# Patient Record
Sex: Male | Born: 1945 | Race: White | Hispanic: No | Marital: Single | Smoking: Never smoker
Health system: Southern US, Community
[De-identification: ages and names within clinical notes are randomized; demographics above are authoritative.]

## PROBLEM LIST (undated history)

## (undated) DIAGNOSIS — N289 Disorder of kidney and ureter, unspecified: Secondary | ICD-10-CM

## (undated) DIAGNOSIS — D369 Benign neoplasm, unspecified site: Secondary | ICD-10-CM

## (undated) DIAGNOSIS — E785 Hyperlipidemia, unspecified: Secondary | ICD-10-CM

## (undated) DIAGNOSIS — Z87442 Personal history of urinary calculi: Secondary | ICD-10-CM

## (undated) DIAGNOSIS — C61 Malignant neoplasm of prostate: Secondary | ICD-10-CM

## (undated) DIAGNOSIS — I10 Essential (primary) hypertension: Secondary | ICD-10-CM

## (undated) DIAGNOSIS — N529 Male erectile dysfunction, unspecified: Secondary | ICD-10-CM

## (undated) HISTORY — PX: PROSTATECTOMY: SHX69

## (undated) HISTORY — PX: TONSILLECTOMY: SUR1361

## (undated) HISTORY — DX: Hyperlipidemia, unspecified: E78.5

## (undated) HISTORY — DX: Benign neoplasm, unspecified site: D36.9

## (undated) HISTORY — DX: Disorder of kidney and ureter, unspecified: N28.9

## (undated) HISTORY — PX: COLONOSCOPY: SHX174

## (undated) HISTORY — PX: CARDIAC CATHETERIZATION: SHX172

## (undated) HISTORY — PX: CHOLECYSTECTOMY: SHX55

## (undated) HISTORY — PX: HERNIA REPAIR: SHX51

## (undated) HISTORY — DX: Male erectile dysfunction, unspecified: N52.9

## (undated) HISTORY — DX: Personal history of urinary calculi: Z87.442

---

## 2004-08-21 ENCOUNTER — Ambulatory Visit: Payer: Self-pay | Admitting: Gastroenterology

## 2004-09-24 ENCOUNTER — Ambulatory Visit: Payer: Self-pay | Admitting: Urology

## 2006-12-06 ENCOUNTER — Ambulatory Visit: Payer: Self-pay | Admitting: Urology

## 2007-09-05 ENCOUNTER — Ambulatory Visit: Payer: Self-pay | Admitting: Family Medicine

## 2007-09-08 ENCOUNTER — Ambulatory Visit: Payer: Self-pay | Admitting: Family Medicine

## 2007-11-22 ENCOUNTER — Ambulatory Visit: Payer: Self-pay | Admitting: Otolaryngology

## 2011-09-07 ENCOUNTER — Emergency Department: Payer: Self-pay | Admitting: Emergency Medicine

## 2011-09-07 LAB — COMPREHENSIVE METABOLIC PANEL
Albumin: 4.1 g/dL (ref 3.4–5.0)
Alkaline Phosphatase: 110 U/L (ref 50–136)
Calcium, Total: 10 mg/dL (ref 8.5–10.1)
Creatinine: 1.77 mg/dL — ABNORMAL HIGH (ref 0.60–1.30)
EGFR (Non-African Amer.): 41 — ABNORMAL LOW
Osmolality: 288 (ref 275–301)
Potassium: 4.1 mmol/L (ref 3.5–5.1)
SGOT(AST): 56 U/L — ABNORMAL HIGH (ref 15–37)
Sodium: 142 mmol/L (ref 136–145)

## 2011-09-07 LAB — URINALYSIS, COMPLETE
Bacteria: NONE SEEN
Bilirubin,UR: NEGATIVE
Glucose,UR: NEGATIVE mg/dL (ref 0–75)
Ketone: NEGATIVE
Protein: NEGATIVE
RBC,UR: <1 /HPF (ref 0–5)
Specific Gravity: 1.017 (ref 1.003–1.030)
Squamous Epithelial: NONE SEEN
WBC UR: 1 /HPF (ref 0–5)

## 2011-09-07 LAB — CBC
HCT: 43.4 % (ref 40.0–52.0)
HGB: 14.6 g/dL (ref 13.0–18.0)
MCH: 28.9 pg (ref 26.0–34.0)
RDW: 14 % (ref 11.5–14.5)
WBC: 11.2 10*3/uL — ABNORMAL HIGH (ref 3.8–10.6)

## 2011-09-07 LAB — LIPASE, BLOOD: Lipase: 180 U/L (ref 73–393)

## 2011-09-07 LAB — TROPONIN I: Troponin-I: <0.02 ng/mL

## 2011-11-20 ENCOUNTER — Inpatient Hospital Stay: Payer: Self-pay | Admitting: Surgery

## 2011-11-20 LAB — URINALYSIS, COMPLETE
Bilirubin,UR: NEGATIVE
Glucose,UR: NEGATIVE mg/dL (ref 0–75)
Hyaline Cast: 13
Nitrite: NEGATIVE
Ph: 5 (ref 4.5–8.0)
Protein: 30
Specific Gravity: 1.02 (ref 1.003–1.030)
Squamous Epithelial: 1
Transitional Epi: <1
WBC UR: 7 /HPF (ref 0–5)

## 2011-11-20 LAB — CBC
HGB: 14.9 g/dL (ref 13.0–18.0)
MCHC: 33.8 g/dL (ref 32.0–36.0)
MCV: 86 fL (ref 80–100)
Platelet: 262 10*3/uL (ref 150–440)
RDW: 13.7 % (ref 11.5–14.5)
WBC: 14.9 10*3/uL — ABNORMAL HIGH (ref 3.8–10.6)

## 2011-11-20 LAB — COMPREHENSIVE METABOLIC PANEL
Albumin: 3.8 g/dL (ref 3.4–5.0)
Alkaline Phosphatase: 115 U/L (ref 50–136)
Anion Gap: 15 (ref 7–16)
BUN: 38 mg/dL — ABNORMAL HIGH (ref 7–18)
Co2: 20 mmol/L — ABNORMAL LOW (ref 21–32)
EGFR (Non-African Amer.): 21 — ABNORMAL LOW
Glucose: 123 mg/dL — ABNORMAL HIGH (ref 65–99)
SGOT(AST): 42 U/L — ABNORMAL HIGH (ref 15–37)
SGPT (ALT): 16 U/L
Sodium: 132 mmol/L — ABNORMAL LOW (ref 136–145)
Total Protein: 9.5 g/dL — ABNORMAL HIGH (ref 6.4–8.2)

## 2011-11-20 LAB — CK TOTAL AND CKMB (NOT AT ARMC)
CK, Total: 67 U/L (ref 35–232)
CK-MB: 0.5 ng/mL (ref 0.5–3.6)

## 2011-11-20 LAB — TROPONIN I: Troponin-I: <0.02 ng/mL

## 2011-11-21 LAB — COMPREHENSIVE METABOLIC PANEL
Albumin: 2.4 g/dL — ABNORMAL LOW (ref 3.4–5.0)
Alkaline Phosphatase: 107 U/L (ref 50–136)
BUN: 34 mg/dL — ABNORMAL HIGH (ref 7–18)
Bilirubin,Total: 1.5 mg/dL — ABNORMAL HIGH (ref 0.2–1.0)
Calcium, Total: 8.1 mg/dL — ABNORMAL LOW (ref 8.5–10.1)
Chloride: 102 mmol/L (ref 98–107)
Co2: 24 mmol/L (ref 21–32)
Creatinine: 2.79 mg/dL — ABNORMAL HIGH (ref 0.60–1.30)
Glucose: 94 mg/dL (ref 65–99)
Osmolality: 279 (ref 275–301)
Potassium: 4.1 mmol/L (ref 3.5–5.1)
SGOT(AST): 29 U/L (ref 15–37)
SGPT (ALT): 12 U/L
Sodium: 136 mmol/L (ref 136–145)
Total Protein: 6.5 g/dL (ref 6.4–8.2)

## 2011-11-21 LAB — CBC WITH DIFFERENTIAL/PLATELET
Basophil #: 0 10*3/uL (ref 0.0–0.1)
Basophil %: 0.2 %
Eosinophil #: 0.1 10*3/uL (ref 0.0–0.7)
Eosinophil %: 0.7 %
HGB: 10.9 g/dL — ABNORMAL LOW (ref 13.0–18.0)
Lymphocyte #: 1.2 10*3/uL (ref 1.0–3.6)
Lymphocyte %: 10.3 %
MCHC: 34.3 g/dL (ref 32.0–36.0)
MCV: 86 fL (ref 80–100)
Monocyte #: 0.5 10*3/uL (ref 0.0–0.7)
Neutrophil #: 9.5 10*3/uL — ABNORMAL HIGH (ref 1.4–6.5)
Neutrophil %: 84.1 %
RBC: 3.67 10*6/uL — ABNORMAL LOW (ref 4.40–5.90)
RDW: 13.8 % (ref 11.5–14.5)

## 2011-11-22 LAB — COMPREHENSIVE METABOLIC PANEL
Albumin: 2.5 g/dL — ABNORMAL LOW (ref 3.4–5.0)
Anion Gap: 9 (ref 7–16)
BUN: 19 mg/dL — ABNORMAL HIGH (ref 7–18)
Bilirubin,Total: 1.4 mg/dL — ABNORMAL HIGH (ref 0.2–1.0)
Calcium, Total: 8.7 mg/dL (ref 8.5–10.1)
Chloride: 101 mmol/L (ref 98–107)
Co2: 26 mmol/L (ref 21–32)
Creatinine: 2.22 mg/dL — ABNORMAL HIGH (ref 0.60–1.30)
EGFR (African American): 38 — ABNORMAL LOW
EGFR (Non-African Amer.): 32 — ABNORMAL LOW
Glucose: 95 mg/dL (ref 65–99)
Osmolality: 274 (ref 275–301)
Potassium: 4.1 mmol/L (ref 3.5–5.1)
SGOT(AST): 36 U/L (ref 15–37)

## 2011-11-22 LAB — CBC WITH DIFFERENTIAL/PLATELET
Basophil %: 0.1 %
Eosinophil #: 0.1 10*3/uL (ref 0.0–0.7)
Eosinophil %: 0.5 %
HCT: 34.6 % — ABNORMAL LOW (ref 40.0–52.0)
HGB: 11.5 g/dL — ABNORMAL LOW (ref 13.0–18.0)
Lymphocyte #: 1 10*3/uL (ref 1.0–3.6)
Lymphocyte %: 9.7 %
MCH: 29.1 pg (ref 26.0–34.0)
MCHC: 33.4 g/dL (ref 32.0–36.0)
MCV: 87 fL (ref 80–100)
Monocyte #: 0.8 10*3/uL — ABNORMAL HIGH (ref 0.0–0.7)
Neutrophil %: 82.5 %
RDW: 13.5 % (ref 11.5–14.5)
WBC: 10.5 10*3/uL (ref 3.8–10.6)

## 2011-11-23 LAB — COMPREHENSIVE METABOLIC PANEL
Albumin: 2.1 g/dL — ABNORMAL LOW (ref 3.4–5.0)
Calcium, Total: 8.2 mg/dL — ABNORMAL LOW (ref 8.5–10.1)
Chloride: 102 mmol/L (ref 98–107)
Co2: 24 mmol/L (ref 21–32)
Creatinine: 1.92 mg/dL — ABNORMAL HIGH (ref 0.60–1.30)
EGFR (African American): 45 — ABNORMAL LOW
EGFR (Non-African Amer.): 37 — ABNORMAL LOW
Glucose: 108 mg/dL — ABNORMAL HIGH (ref 65–99)
Osmolality: 273 (ref 275–301)
Potassium: 4.1 mmol/L (ref 3.5–5.1)
SGOT(AST): 52 U/L — ABNORMAL HIGH (ref 15–37)
Total Protein: 6.2 g/dL — ABNORMAL LOW (ref 6.4–8.2)

## 2011-11-23 LAB — CBC WITH DIFFERENTIAL/PLATELET
Basophil %: 0.2 %
Eosinophil %: 1.6 %
HCT: 29.5 % — ABNORMAL LOW (ref 40.0–52.0)
Lymphocyte #: 0.8 10*3/uL — ABNORMAL LOW (ref 1.0–3.6)
Lymphocyte %: 9.8 %
MCH: 29.5 pg (ref 26.0–34.0)
MCV: 86 fL (ref 80–100)
Monocyte #: 0.9 10*3/uL — ABNORMAL HIGH (ref 0.0–0.7)
Platelet: 183 10*3/uL (ref 150–440)
RBC: 3.44 10*6/uL — ABNORMAL LOW (ref 4.40–5.90)
RDW: 13.7 % (ref 11.5–14.5)
WBC: 8.1 10*3/uL (ref 3.8–10.6)

## 2011-11-23 LAB — PATHOLOGY REPORT

## 2011-11-24 LAB — COMPREHENSIVE METABOLIC PANEL
Albumin: 2 g/dL — ABNORMAL LOW (ref 3.4–5.0)
Alkaline Phosphatase: 149 U/L — ABNORMAL HIGH (ref 50–136)
BUN: 12 mg/dL (ref 7–18)
Bilirubin,Total: 0.4 mg/dL (ref 0.2–1.0)
Calcium, Total: 8.2 mg/dL — ABNORMAL LOW (ref 8.5–10.1)
Co2: 27 mmol/L (ref 21–32)
Creatinine: 1.99 mg/dL — ABNORMAL HIGH (ref 0.60–1.30)
EGFR (African American): 44 — ABNORMAL LOW
EGFR (Non-African Amer.): 36 — ABNORMAL LOW
Glucose: 104 mg/dL — ABNORMAL HIGH (ref 65–99)
Osmolality: 278 (ref 275–301)
Potassium: 3.9 mmol/L (ref 3.5–5.1)
SGOT(AST): 58 U/L — ABNORMAL HIGH (ref 15–37)
Total Protein: 6.2 g/dL — ABNORMAL LOW (ref 6.4–8.2)

## 2011-11-24 LAB — CBC WITH DIFFERENTIAL/PLATELET
Basophil #: 0 10*3/uL (ref 0.0–0.1)
Basophil %: 0.6 %
Lymphocyte %: 14.2 %
MCH: 29.4 pg (ref 26.0–34.0)
MCHC: 34.2 g/dL (ref 32.0–36.0)
Monocyte #: 0.8 10*3/uL — ABNORMAL HIGH (ref 0.0–0.7)
Monocyte %: 14.3 %
RBC: 3.39 10*6/uL — ABNORMAL LOW (ref 4.40–5.90)
RDW: 13.8 % (ref 11.5–14.5)

## 2013-07-30 ENCOUNTER — Emergency Department: Payer: Self-pay | Admitting: Emergency Medicine

## 2013-08-09 ENCOUNTER — Emergency Department: Payer: Self-pay | Admitting: Emergency Medicine

## 2014-10-10 ENCOUNTER — Ambulatory Visit: Payer: Self-pay | Admitting: Gastroenterology

## 2014-12-15 NOTE — Op Note (Signed)
PATIENT NAME:  Billy Walker, Billy Walker MR#:  [id number] DATE OF BIRTH:  May 20, 1946  DATE OF PROCEDURE:  11/22/2011  PREOPERATIVE DIAGNOSIS: Acute cholecystitis.   POSTOPERATIVE DIAGNOSIS: Acute cholecystitis.   PROCEDURE: Laparoscopic cholecystectomy.   SURGEON: Phoebe Perch, MD   ANESTHESIA: General with endotracheal tube.   INDICATIONS: This is a patient with unrelenting right upper quadrant pain. He has been ill for approximately two months, worsening over the last week. Preoperatively we discussed the rationale for surgery, the options of observation, risks of bleeding, infection, recurrence of symptoms, failure to resolve the symptoms, open procedure, bile duct damage, bile duct leak, bowel injury, retained common bile duct stone, any of which could require further surgery and/or ERCP, stent, and papillotomy. This was all reviewed for him in the preop holding area again prior to surgery, and he understood and agreed to proceed. No family was present. Questions were answered for him.   FINDINGS: Acute and long-standing chronic cholecystitis. It was superimposed with acute cholecystitis. Two large gallstones were retrieved.   DESCRIPTION OF PROCEDURE: The patient was induced to general anesthesia. He was on IV antibiotics and VTE prophylaxis. He was prepped and draped in a sterile fashion. Marcaine was infiltrated in the skin and subcutaneous tissues around the supraumbilical area. An incision was made, a Veress needle was placed, pneumoperitoneum was obtained, and a 5 mm trocar port was placed. The abdominal cavity was explored, and under direct vision a 10 mm epigastric port and two lateral 5 mm ports were placed. The camera was placed in the epigastric site to view back at the periumbilical site where he had had some sort of hernia repaired at the time of his prior prostatectomy. There was no sign of adhesions and no sign of bowel injury.   With the camera in the periumbilical site, the  gallbladder was found to be completely encased in fibrinopurulent-covered omentum. This omentum was brought back with both sharp and blunt dissection off of the liver edge allowing for visualization of the fundus of the gallbladder. The gallbladder fundus was elevated, and it was necessary to change to a 30-degree scope at this point. The chronic and very dense and fibrous adhesions of omentum were taken down bluntly and sharply on the anterior surface of the gallbladder allowing for visualization of the infundibulum of the gallbladder. Here, two separate small vessels were doubly clipped and divided, one of which was believed to be the cystic artery; and this allowed for good visualization of what was believed to be the cystic duct. The cystic duct was incised and bilious material came from it, although it was somewhat white in color suggesting hydropic material. This was then doubly clipped and fully divided, and the gallbladder was taken from the gallbladder fossa with both sharp, blunt and electrocautery dissection. It was greatly thickened and fibrous and also with superimposed acute inflammation with edema. The specimen was passed out through the epigastric port site with the aid of an EndoCatch bag. Two spilled stones were identified and retrieved. They were large berry-type stones, and no further stone fragments were identified in the abdomen. The area was irrigated with copious amounts of normal saline. There was no sign of bile leak at this point.   The specimen was examined on the back table and it was opened. A portion of the wall of the gallbladder appeared to be missing cephalad, and the cystic duct was attempted to be cannulated but it was so fibrotic that it was completely occluded; and no other  exit point from the gallbladder was identified.   With the findings of probable remaining gallbladder wall on the liver, attention was turned to the fundus portion of the gallbladder fossa where some  mucosa was identified. This was fulgurated with electrocautery. The area was again checked for hemostasis. There was a minimal ooze from the gallbladder fossa, and Surgicel was placed after irrigating and ensuring that there was no arterial bleeding or bleeding that could be stopped with electrocautery.   Into the foramen of [place] through a lateral port site was placed a 10 mm JP drain which was held in with 3-0 nylon. Again, hemostasis was found to be adequate. There was no sign of bile leak or bowel injury. The camera was again placed in the epigastric port to view back at the periumbilical site. There was no sign of bowel injury or adhesions, therefore, pneumoperitoneum was released. All ports were removed. The fascial edges at the epigastric site were approximated with 0 Vicryl figure-of-eight sutures. The drain was placed to bulb suction, and then a 4-0 subcuticular Monocryl was used at all skin edges. Steri-Strips, Mastisol, and sterile dressings were placed.      The patient tolerated the procedure well. There were no complications. He was taken to the recovery room in stable condition to be admitted for continued care.   ____________________________ Jerrol Banana. Burt Knack, MD rec:cbb D: 11/22/2011 11:32:42 ET T: 11/22/2011 13:48:41 ET JOB#: [id number]  cc: Jerrol Banana. Burt Knack, MD, <Dictator> Florene Glen MD ELECTRONICALLY SIGNED 11/23/2011 9:55

## 2014-12-15 NOTE — H&P (Signed)
Subjective/Chief Complaint RUQ apin    History of Present Illness 2 mos rec pain, nausea no emesis pain much worse today. no f/c no dark urine    Past History PMH prostate cancer HTN  PSH robotic prostatectomy, hernia repair    Past Medical Health Hypertension, Cancer   Past Med/Surgical Hx:  Hypertension:   ALLERGIES:  No Known Allergies:   Family and Social History:   Family History Non-Contributory    Social History negative tobacco, negative ETOH, Passenger transport manager of [employer]   Review of Systems:   Fever/Chills No    Cough No    Abdominal Pain Yes    Diarrhea No    Constipation No    Nausea/Vomiting No    SOB/DOE No    Chest Pain No    Dysuria No   Physical Exam:   GEN NAD    HEENT pink conjunctivae    NECK supple    RESP normal resp effort  clear BS    CARD regular rate    ABD positive tenderness  distended  RUQ with Murphy's sign    LYMPH negative neck    EXTR negative edema    SKIN normal to palpation, no jaundice    PSYCH alert, A+O to time, place, person, good insight   Routine Hem:  30-Mar-13 11:58    WBC (CBC) 14.9   RBC (CBC) 5.12   Hemoglobin (CBC) 14.9   Hematocrit (CBC) 44.0   Platelet Count (CBC) 262   MCV 86   MCH 29.1   MCHC 33.8   RDW 13.7  Routine Chem:  30-Mar-13 11:58    Glucose, Serum 123   BUN 38   Creatinine (comp) 3.20   Sodium, Serum 132   Potassium, Serum 4.0   Chloride, Serum 97   CO2, Serum 20   Calcium (Total), Serum 9.4  Hepatic:  30-Mar-13 11:58    Bilirubin, Total 1.7   Alkaline Phosphatase 115   SGPT (ALT) 16   SGOT (AST) 42   Total Protein, Serum 9.5   Albumin, Serum 3.8  Routine Chem:  30-Mar-13 11:58    Osmolality (calc) 275   eGFR (African American) 25   eGFR (Non-African American) 21   Anion Gap 15  Cardiac:  30-Mar-13 11:58    CK, Total 67   CPK-MB, Serum 0.5   Troponin I < 0.02  Routine Chem:  30-Mar-13 11:58    Lipase 224   Radiology  Results: XRay:    30-Mar-13 11:27, Chest PA and Lateral   Chest PA and Lateral   REASON FOR EXAM:    pain  COMMENTS:   May transport without cardiac monitor    PROCEDURE: DXR - DXR CHEST PA (OR AP) AND LATERAL  - Nov 20 2011 11:27AM     RESULT: There is very shallow inspiratory effort. A band of density at   the right lung base suggests minimal right middle lobe atelectasis. The   heart size is normal. There is no edema, significant effusion or   pneumothorax. Minimal density at the left lung base suggests atelectasis.   There is no effusion.    IMPRESSION:   1. No acute cardiopulmonary disease evident. Shallow inspiration with   basilar atelectasis right greater than left.      Verified By: Sundra Aland, M.D., MD  CT:    30-Mar-13 12:17, CT Abdomen Pelvis WO for Stone   CT Abdomen Pelvis WO for [employer]  REASON FOR EXAM:    r flank pain, sob  COMMENTS:       PROCEDURE: CT  - CT ABDOMEN /PELVIS WO (STONE)  - Nov 20 2011 12:17PM     RESULT: Emergent CT of the abdomen and pelvis without contrast is   reconstructed at 3.0 mm slice thickness in the axial plane and compared   to previous exams dated 07 September 2011.    The lung bases appear clear. A small hiatal hernia is present. There is   significant pericolonic stranding involving the right colon especially in   the subhepatic region in the area ofthe hepatic flexure with significant   pericholecystic stranding. Correlate for acute cholecystitis versus the   less likely differential consideration of colitis. The appendix appears   normal.  The thoracic aorta contains atherosclerotic calcification without   evidence of an aneurysm area the liver is unremarkable. The pancreas,   spleen, adrenal glands and kidneys appear grossly normal for a   noncontrast exam. There is a slight outward protrude into the lateral   aspect of the mid to lower right kidney. Correlate on subsequent   ultrasound for possible right renal mass.  No abnormal bowel distention is   present. There is no evidence of perforation. There is no abscess. The   urinary bladder is nondistended. There is a calcification present within   the urinary bladder which is noted on the previous exam as well. This   measures is much as 5.5 mm diameter. The second calcification is seen in   the midline dependent region the bladder and is slightly smaller. This   was also present onthe previous exam.    No definite radiopaque gallstones are evident.    IMPRESSION:   1. Findings concerning for acute cholecystitis. Correlate with abdominal   sonogram area differential considerations include colitis or less likely,   diverticulitis in the right colon. No abscess or bowel perforation is   evident.  2. Small sliding-type hiatal hernia.  3. Slight deformity of the mid to lower portion of the right kidney   laterally. This is unchanged. The possibility of a small right renal mass   or slight lobulation should be evaluated with ultrasound or followup   contrast enhanced study at a later date.(*)          Verified By: Sundra Aland, M.D., MD     Assessment/Admission Diagnosis ac cholecystitis renal failure admit hydrate recheck labs lap chole when RF improved   Electronic Signatures: Florene Glen (MD)  (Signed 30-Mar-13 14:00)  Authored: CHIEF COMPLAINT and HISTORY, PAST MEDICAL/SURGIAL HISTORY, ALLERGIES, FAMILY AND SOCIAL HISTORY, REVIEW OF SYSTEMS, PHYSICAL EXAM, LABS, Radiology, ASSESSMENT AND PLAN   Last Updated: 30-Mar-13 14:00 by Florene Glen (MD)

## 2014-12-15 NOTE — Discharge Summary (Signed)
PATIENT NAME:  Billy Walker, Billy Walker MR#:  [id number] DATE OF BIRTH:  [birth year]  DATE OF ADMISSION:  11/20/2011 DATE OF DISCHARGE:  11/24/2011  DISCHARGE DIAGNOSES:  1. Cholelithiasis with acute cholecystitis.  2. Hypertension. 3. Prostate cancer.   PROCEDURE: Laparoscopic cholecystectomy.  HISTORY OF PRESENT ILLNESS/HOSPITAL COURSE: This is a patient who was admitted to the hospital with severe right upper quadrant pain. He also had labs suggestive of acute renal failure. He was admitted to the hospital and started on IV antibiotics. He continued to have fevers. Fluid hydration and resuscitation resulted in improvement in his creatinine below 2, from 3.5, at which time he was taken to the operating room for laparoscopic cholecystectomy. His gallbladder was very inflamed and infected. Postoperatively, his JP drain showed no bile. It was ultimately removed. He was advanced to a regular diet, his fever abated, and he was discharged in stable condition with instructions to resume his regular home medications, and sent home with Vicodin. He was instructed on dressing changes and to follow up in my office in 10 days or to call if he has any problems whatsoever. ____________________________ Jerrol Banana Burt Knack, MD rec:slb D: 11/24/2011 08:23:51 ET T: 11/24/2011 14:03:36 ET JOB#: [id number]  cc: Jerrol Banana. Burt Knack, MD, <Dictator> Florene Glen MD ELECTRONICALLY SIGNED 11/24/2011 17:26

## 2014-12-15 NOTE — H&P (Signed)
PATIENT NAME:  Billy Walker, Billy Walker MR#:  [id number] DATE OF BIRTH:  [birth year]  DATE OF ADMISSION:  11/20/2011  CHIEF COMPLAINT: Right upper quadrant pain.   HISTORY OF PRESENT ILLNESS: This is a patient with approximately two months of abdominal pain and it comes and goes. Yesterday he had a bad attack of the pain and then today it was even worse than what it had been and he's had some nausea but no emesis. He has had no fevers or chills. No jaundice or acholic stools. He denies melena or hematochezia.   I was asked to see the patient for probable acute cholecystitis in the ER after CT scan was suggestive of that.   PAST MEDICAL HISTORY:  1. Hypertension. 2. Prostate cancer.   PAST SURGICAL HISTORY: Robotic radical prostatectomy presumed at [facility] as well as concomitant probable umbilical hernia repair.   ALLERGIES: None.   MEDICATIONS:  1. Amlodipine. 2. Hydrochlorothiazide.   FAMILY HISTORY: Noncontributory.   SOCIAL HISTORY: The patient does not smoke or drink. He runs a Warehouse manager.  REVIEW OF SYSTEMS: 10 system review was performed and negative with the exception of that mentioned in the history of present illness.   PHYSICAL EXAMINATION:   GENERAL: Healthy, comfortable appearing Caucasian male patient.   VITAL SIGNS: Temperature 96.6, pulse 80, respirations 22, blood pressure 93/59, 97% room air sat. Pain scale is 7.   HEENT: No scleral icterus.   NECK: No palpable neck nodes.   CHEST: Clear to auscultation.   CARDIAC: Regular rate and rhythm.   ABDOMEN: Abdomen is showing tenderness and some guarding in the right upper quadrant and a positive Murphy's sign. Multiple scars are noted, the largest one being in the periumbilical area. No sign of recurrent hernia.   INTEGUMENTARY: No jaundice.   EXTREMITIES: Calves are nontender. Extremities are without edema.   NEUROLOGIC: Grossly intact.   LABORATORY, DIAGNOSTIC, AND RADIOLOGICAL DATA: Lipase 224. AST 42, ALT 16,  bilirubin 1.7, CO2 20, sodium 132, creatinine 3.2, BUN 38, glucose 123. CT scan will be personally reviewed. It is suggestive of acute cholecystitis. On previous CT scan done in January there was no mention of gallstones in that report.   ASSESSMENT AND PLAN: This is a patient with likely acute cholecystitis. The patient's creatinine is quite elevated and he needs to be hydrated. He has not been eating or drinking much this week due to his pain. He has not been vomiting, however. Without in mind, I would like to admit the patient to the hospital, start IV antibiotics, and treat his electrolyte abnormalities and treat his renal failure which is likely related to ATN. Fluid boluses will be given and he will be re-examined. Once his renal failure is improved, then laparoscopic cholecystectomy can be performed safely. This plan and approach was discussed with him. He understood and agreed to proceed.  ____________________________ Jerrol Banana Burt Knack, MD rec:drc D: 11/20/2011 14:04:37 ET T: 11/20/2011 14:24:01 ET JOB#: [id number] Florene Glen MD ELECTRONICALLY SIGNED 11/20/2011 17:09

## 2014-12-16 LAB — SURGICAL PATHOLOGY

## 2017-04-10 ENCOUNTER — Inpatient Hospital Stay
Admission: EM | Admit: 2017-04-10 | Discharge: 2017-04-12 | DRG: 247 | Disposition: A | Discharge status: Home / Self Care | Payer: Medicare Other | Attending: Internal Medicine | Admitting: Internal Medicine

## 2017-04-10 ENCOUNTER — Encounter: Payer: Self-pay | Admitting: Emergency Medicine

## 2017-04-10 ENCOUNTER — Emergency Department: Payer: Medicare Other

## 2017-04-10 ENCOUNTER — Encounter
Admission: EM | Disposition: A | Discharge status: Home / Self Care | Payer: Self-pay | Source: Home / Self Care | Attending: Internal Medicine

## 2017-04-10 DIAGNOSIS — N183 Chronic kidney disease, stage 3 (moderate): Secondary | ICD-10-CM | POA: Diagnosis present

## 2017-04-10 DIAGNOSIS — I10 Essential (primary) hypertension: Secondary | ICD-10-CM

## 2017-04-10 DIAGNOSIS — Z9049 Acquired absence of other specified parts of digestive tract: Secondary | ICD-10-CM

## 2017-04-10 DIAGNOSIS — Z8249 Family history of ischemic heart disease and other diseases of the circulatory system: Secondary | ICD-10-CM

## 2017-04-10 DIAGNOSIS — R0789 Other chest pain: Secondary | ICD-10-CM | POA: Diagnosis present

## 2017-04-10 DIAGNOSIS — I255 Ischemic cardiomyopathy: Secondary | ICD-10-CM | POA: Diagnosis present

## 2017-04-10 DIAGNOSIS — Z7982 Long term (current) use of aspirin: Secondary | ICD-10-CM

## 2017-04-10 DIAGNOSIS — I129 Hypertensive chronic kidney disease with stage 1 through stage 4 chronic kidney disease, or unspecified chronic kidney disease: Secondary | ICD-10-CM | POA: Diagnosis present

## 2017-04-10 DIAGNOSIS — Z79899 Other long term (current) drug therapy: Secondary | ICD-10-CM

## 2017-04-10 DIAGNOSIS — I2102 ST elevation (STEMI) myocardial infarction involving left anterior descending coronary artery: Secondary | ICD-10-CM | POA: Diagnosis present

## 2017-04-10 DIAGNOSIS — E785 Hyperlipidemia, unspecified: Secondary | ICD-10-CM | POA: Diagnosis present

## 2017-04-10 DIAGNOSIS — I2510 Atherosclerotic heart disease of native coronary artery without angina pectoris: Secondary | ICD-10-CM | POA: Diagnosis present

## 2017-04-10 DIAGNOSIS — Z9079 Acquired absence of other genital organ(s): Secondary | ICD-10-CM | POA: Diagnosis not present

## 2017-04-10 DIAGNOSIS — I213 ST elevation (STEMI) myocardial infarction of unspecified site: Secondary | ICD-10-CM | POA: Diagnosis present

## 2017-04-10 DIAGNOSIS — Z801 Family history of malignant neoplasm of trachea, bronchus and lung: Secondary | ICD-10-CM

## 2017-04-10 DIAGNOSIS — Z8546 Personal history of malignant neoplasm of prostate: Secondary | ICD-10-CM

## 2017-04-10 HISTORY — DX: Malignant neoplasm of prostate: C61

## 2017-04-10 HISTORY — PX: CORONARY STENT INTERVENTION: CATH118234

## 2017-04-10 HISTORY — DX: Essential (primary) hypertension: I10

## 2017-04-10 HISTORY — PX: LEFT HEART CATH AND CORONARY ANGIOGRAPHY: CATH118249

## 2017-04-10 LAB — COMPREHENSIVE METABOLIC PANEL
ALBUMIN: 4.2 g/dL (ref 3.5–5.0)
ALK PHOS: 108 U/L (ref 38–126)
ALT: 17 U/L (ref 17–63)
ALT: 16 U/L — AB (ref 17–63)
AST: 52 U/L — ABNORMAL HIGH (ref 15–41)
AST: 46 U/L — AB (ref 15–41)
Albumin: 3.8 g/dL (ref 3.5–5.0)
Alkaline Phosphatase: 101 U/L (ref 38–126)
Anion gap: 11 (ref 5–15)
Anion gap: 11 (ref 5–15)
BILIRUBIN TOTAL: 0.9 mg/dL (ref 0.3–1.2)
BUN: 24 mg/dL — ABNORMAL HIGH (ref 6–20)
BUN: 24 mg/dL — AB (ref 6–20)
CALCIUM: 9.7 mg/dL (ref 8.9–10.3)
CHLORIDE: 111 mmol/L (ref 101–111)
CO2: 18 mmol/L — AB (ref 22–32)
CO2: 16 mmol/L — AB (ref 22–32)
CREATININE: 2 mg/dL — AB (ref 0.61–1.24)
CREATININE: 1.84 mg/dL — AB (ref 0.61–1.24)
Calcium: 9.2 mg/dL (ref 8.9–10.3)
Chloride: 109 mmol/L (ref 101–111)
GFR calc Af Amer: 37 mL/min — ABNORMAL LOW (ref >60)
GFR calc Af Amer: 41 mL/min — ABNORMAL LOW (ref >60)
GFR calc non Af Amer: 32 mL/min — ABNORMAL LOW (ref >60)
GFR calc non Af Amer: 35 mL/min — ABNORMAL LOW (ref >60)
GLUCOSE: 167 mg/dL — AB (ref 65–99)
GLUCOSE: 166 mg/dL — AB (ref 65–99)
POTASSIUM: 3.3 mmol/L — AB (ref 3.5–5.1)
Potassium: 3.7 mmol/L (ref 3.5–5.1)
SODIUM: 138 mmol/L (ref 135–145)
SODIUM: 138 mmol/L (ref 135–145)
TOTAL PROTEIN: 8 g/dL (ref 6.5–8.1)
Total Bilirubin: 0.5 mg/dL (ref 0.3–1.2)
Total Protein: 7.4 g/dL (ref 6.5–8.1)

## 2017-04-10 LAB — LIPID PANEL
Cholesterol: 195 mg/dL (ref 0–200)
HDL: 34 mg/dL — ABNORMAL LOW (ref >40)
LDL CALC: 125 mg/dL — AB (ref 0–99)
Total CHOL/HDL Ratio: 5.7 RATIO
Triglycerides: 182 mg/dL — ABNORMAL HIGH (ref <150)
VLDL: 36 mg/dL (ref 0–40)

## 2017-04-10 LAB — CBC WITH DIFFERENTIAL/PLATELET
BASOS PCT: 1 %
Basophils Absolute: 0.2 10*3/uL — ABNORMAL HIGH (ref 0–0.1)
EOS ABS: 0.2 10*3/uL (ref 0–0.7)
Eosinophils Relative: 2 %
HEMATOCRIT: 42.9 % (ref 40.0–52.0)
Hemoglobin: 14.9 g/dL (ref 13.0–18.0)
Lymphocytes Relative: 31 %
Lymphs Abs: 4 10*3/uL — ABNORMAL HIGH (ref 1.0–3.6)
MCH: 28.7 pg (ref 26.0–34.0)
MCHC: 34.7 g/dL (ref 32.0–36.0)
MCV: 82.7 fL (ref 80.0–100.0)
MONO ABS: 1 10*3/uL (ref 0.2–1.0)
MONOS PCT: 8 %
Neutro Abs: 7.5 10*3/uL — ABNORMAL HIGH (ref 1.4–6.5)
Neutrophils Relative %: 58 %
Platelets: 286 10*3/uL (ref 150–440)
RBC: 5.18 MIL/uL (ref 4.40–5.90)
RDW: 14.5 % (ref 11.5–14.5)
WBC: 12.8 10*3/uL — ABNORMAL HIGH (ref 3.8–10.6)

## 2017-04-10 LAB — TROPONIN I
Troponin I: <0.03 ng/mL (ref <0.03)
Troponin I: 42.07 ng/mL (ref <0.03)

## 2017-04-10 LAB — GLUCOSE, CAPILLARY: Glucose-Capillary: 121 mg/dL — ABNORMAL HIGH (ref 65–99)

## 2017-04-10 LAB — APTT: APTT: 115 s — AB (ref 24–36)

## 2017-04-10 LAB — PROTIME-INR
INR: 1.07
PROTHROMBIN TIME: 13.9 s (ref 11.4–15.2)

## 2017-04-10 LAB — MRSA PCR SCREENING: MRSA by PCR: NEGATIVE

## 2017-04-10 LAB — POCT ACTIVATED CLOTTING TIME: Activated Clotting Time: 400 seconds

## 2017-04-10 SURGERY — LEFT HEART CATH AND CORONARY ANGIOGRAPHY
Anesthesia: Moderate Sedation

## 2017-04-10 MED ORDER — ORAL CARE MOUTH RINSE
15.0000 mL | Freq: Two times a day (BID) | OROMUCOSAL | Status: DC
  Administered 2017-04-10: 15 mL via OROMUCOSAL

## 2017-04-10 MED ORDER — FENTANYL CITRATE (PF) 100 MCG/2ML IJ SOLN
INTRAMUSCULAR | Status: AC
Start: 2017-04-10 — End: ?
  Filled 2017-04-10: qty 2

## 2017-04-10 MED ORDER — SODIUM CHLORIDE 0.9 % IV SOLN
INTRAVENOUS | Status: DC
  Administered 2017-04-10: 1000 mL via INTRAVENOUS

## 2017-04-10 MED ORDER — IOPAMIDOL (ISOVUE-300) INJECTION 61%
INTRAVENOUS | Status: DC | PRN
  Administered 2017-04-10: 365 mL

## 2017-04-10 MED ORDER — MORPHINE SULFATE (PF) 2 MG/ML IV SOLN
INTRAVENOUS | Status: AC
  Administered 2017-04-10: 2 mg via INTRAVENOUS
  Filled 2017-04-10: qty 1

## 2017-04-10 MED ORDER — HEPARIN SODIUM (PORCINE) 5000 UNIT/ML IJ SOLN
4000.0000 [IU] | Freq: Once | INTRAMUSCULAR | Status: AC
  Administered 2017-04-10: 4000 [IU] via INTRAVENOUS

## 2017-04-10 MED ORDER — BIVALIRUDIN BOLUS VIA INFUSION - CUPID
INTRAVENOUS | Status: DC | PRN
  Administered 2017-04-10: 78.225 mg via INTRAVENOUS

## 2017-04-10 MED ORDER — SODIUM CHLORIDE 0.9 % IV SOLN
INTRAVENOUS | Status: AC | PRN
  Administered 2017-04-10: 0.25 mg/kg/h via INTRAVENOUS
  Administered 2017-04-10: 1.75 mg/kg/h via INTRAVENOUS

## 2017-04-10 MED ORDER — MIDAZOLAM HCL 2 MG/2ML IJ SOLN
INTRAMUSCULAR | Status: AC
Start: 2017-04-10 — End: ?
  Filled 2017-04-10: qty 2

## 2017-04-10 MED ORDER — TIROFIBAN HCL IV 12.5 MG/250 ML
INTRAVENOUS | Status: AC
  Filled 2017-04-10: qty 250

## 2017-04-10 MED ORDER — HEPARIN SODIUM (PORCINE) 5000 UNIT/ML IJ SOLN: 60.0000 [IU]/kg | Freq: Once | INTRAMUSCULAR | Status: DC

## 2017-04-10 MED ORDER — ASPIRIN 81 MG PO CHEW: 324.0000 mg | CHEWABLE_TABLET | Freq: Once | ORAL | Status: DC

## 2017-04-10 MED ORDER — TIROFIBAN HCL IN NACL 5-0.9 MG/100ML-% IV SOLN
INTRAVENOUS | Status: AC | PRN
  Administered 2017-04-10: 0.075 ug/kg/min via INTRAVENOUS

## 2017-04-10 MED ORDER — NITROGLYCERIN 0.4 MG SL SUBL
0.4000 mg | SUBLINGUAL_TABLET | SUBLINGUAL | Status: DC | PRN
  Administered 2017-04-10 – 2017-04-11 (×3): 0.4 mg via SUBLINGUAL
  Filled 2017-04-10 (×2): qty 1

## 2017-04-10 MED ORDER — ONDANSETRON HCL 4 MG/2ML IJ SOLN: 4.0000 mg | Freq: Four times a day (QID) | INTRAMUSCULAR | Status: DC | PRN

## 2017-04-10 MED ORDER — SODIUM CHLORIDE 0.9 % IV SOLN: 250.0000 mL | INTRAVENOUS | Status: DC | PRN

## 2017-04-10 MED ORDER — TIROFIBAN (AGGRASTAT) BOLUS VIA INFUSION
INTRAVENOUS | Status: DC | PRN
  Administered 2017-04-10: 2607.5 ug via INTRAVENOUS

## 2017-04-10 MED ORDER — LIDOCAINE HCL (PF) 1 % IJ SOLN
INTRAMUSCULAR | Status: AC
Start: 2017-04-10 — End: ?
  Filled 2017-04-10: qty 30

## 2017-04-10 MED ORDER — HYDRALAZINE HCL 20 MG/ML IJ SOLN
5.0000 mg | INTRAMUSCULAR | Status: AC | PRN
Start: 2017-04-10 — End: 2017-04-11

## 2017-04-10 MED ORDER — TICAGRELOR 90 MG PO TABS
90.0000 mg | ORAL_TABLET | Freq: Two times a day (BID) | ORAL | Status: DC
  Administered 2017-04-11 – 2017-04-12 (×3): 90 mg via ORAL
  Filled 2017-04-10 (×5): qty 1

## 2017-04-10 MED ORDER — ONDANSETRON HCL 4 MG/2ML IJ SOLN
INTRAMUSCULAR | Status: AC
  Administered 2017-04-10: 4 mg via INTRAVENOUS
  Filled 2017-04-10: qty 2

## 2017-04-10 MED ORDER — ADENOSINE (DIAGNOSTIC) FOR INTRACORONARY USE
INTRAVENOUS | Status: DC | PRN
  Administered 2017-04-10: 72 ug via INTRACORONARY

## 2017-04-10 MED ORDER — ACETAMINOPHEN 325 MG PO TABS
650.0000 mg | ORAL_TABLET | ORAL | Status: DC | PRN
  Administered 2017-04-10 – 2017-04-11 (×2): 650 mg via ORAL
  Filled 2017-04-10 (×2): qty 2

## 2017-04-10 MED ORDER — HEPARIN (PORCINE) IN NACL 2-0.9 UNIT/ML-% IJ SOLN
INTRAMUSCULAR | Status: DC | PRN
  Administered 2017-04-10: 19:00:00

## 2017-04-10 MED ORDER — SODIUM CHLORIDE 0.9% FLUSH
3.0000 mL | Freq: Two times a day (BID) | INTRAVENOUS | Status: DC
  Administered 2017-04-11 – 2017-04-12 (×3): 3 mL via INTRAVENOUS

## 2017-04-10 MED ORDER — ONDANSETRON HCL 4 MG/2ML IJ SOLN
4.0000 mg | Freq: Once | INTRAMUSCULAR | Status: AC
  Administered 2017-04-10: 4 mg via INTRAVENOUS

## 2017-04-10 MED ORDER — NITROGLYCERIN 5 MG/ML IV SOLN
INTRAVENOUS | Status: AC
  Filled 2017-04-10: qty 10

## 2017-04-10 MED ORDER — NOREPINEPHRINE BITARTRATE 1 MG/ML IV SOLN
INTRAVENOUS | Status: AC
  Filled 2017-04-10: qty 4

## 2017-04-10 MED ORDER — TICAGRELOR 90 MG PO TABS
ORAL_TABLET | ORAL | Status: AC
  Filled 2017-04-10: qty 2

## 2017-04-10 MED ORDER — ATORVASTATIN CALCIUM 20 MG PO TABS
40.0000 mg | ORAL_TABLET | Freq: Every day | ORAL | Status: DC
  Administered 2017-04-11: 40 mg via ORAL
  Filled 2017-04-10: qty 2

## 2017-04-10 MED ORDER — BIVALIRUDIN TRIFLUOROACETATE 250 MG IV SOLR
INTRAVENOUS | Status: AC
  Filled 2017-04-10: qty 250

## 2017-04-10 MED ORDER — LABETALOL HCL 5 MG/ML IV SOLN
10.0000 mg | INTRAVENOUS | Status: AC | PRN
Start: 2017-04-10 — End: 2017-04-11

## 2017-04-10 MED ORDER — METOPROLOL TARTRATE 25 MG PO TABS
12.5000 mg | ORAL_TABLET | Freq: Two times a day (BID) | ORAL | Status: DC
  Administered 2017-04-10: 12.5 mg via ORAL
  Filled 2017-04-10: qty 1

## 2017-04-10 MED ORDER — ADENOSINE 6 MG/2ML IV SOLN
INTRAVENOUS | Status: AC
  Filled 2017-04-10: qty 4

## 2017-04-10 MED ORDER — ENALAPRIL MALEATE 2.5 MG PO TABS
2.5000 mg | ORAL_TABLET | Freq: Every day | ORAL | Status: DC
  Administered 2017-04-10 – 2017-04-12 (×3): 2.5 mg via ORAL
  Filled 2017-04-10 (×3): qty 1

## 2017-04-10 MED ORDER — LIDOCAINE HCL (PF) 1 % IJ SOLN
INTRAMUSCULAR | Status: DC | PRN
  Administered 2017-04-10: 18 mL via SUBCUTANEOUS

## 2017-04-10 MED ORDER — SODIUM CHLORIDE 0.9% FLUSH: 3.0000 mL | INTRAVENOUS | Status: DC | PRN

## 2017-04-10 MED ORDER — ASPIRIN 81 MG PO CHEW
81.0000 mg | CHEWABLE_TABLET | Freq: Every day | ORAL | Status: DC
  Administered 2017-04-10 – 2017-04-12 (×3): 81 mg via ORAL
  Filled 2017-04-10 (×3): qty 1

## 2017-04-10 MED ORDER — TIROFIBAN HCL IV 12.5 MG/250 ML: 0.0750 ug/kg/min | INTRAVENOUS | Status: AC

## 2017-04-10 MED ORDER — SODIUM CHLORIDE 0.9 % WEIGHT BASED INFUSION
1.0000 mL/kg/h | INTRAVENOUS | Status: AC
  Administered 2017-04-10 – 2017-04-11 (×2): 1 mL/kg/h via INTRAVENOUS

## 2017-04-10 MED ORDER — DOPAMINE-DEXTROSE 3.2-5 MG/ML-% IV SOLN
INTRAVENOUS | Status: AC
  Filled 2017-04-10: qty 250

## 2017-04-10 MED ORDER — TICAGRELOR 90 MG PO TABS
ORAL_TABLET | ORAL | Status: DC | PRN
  Administered 2017-04-10: 180 mg via ORAL

## 2017-04-10 MED ORDER — NITROGLYCERIN 1 MG/10 ML FOR IR/CATH LAB
INTRA_ARTERIAL | Status: DC | PRN
  Administered 2017-04-10 (×2): 200 ug via INTRACORONARY

## 2017-04-10 MED ORDER — MORPHINE SULFATE (PF) 2 MG/ML IV SOLN
2.0000 mg | Freq: Once | INTRAVENOUS | Status: AC
  Administered 2017-04-10: 2 mg via INTRAVENOUS

## 2017-04-10 SURGICAL SUPPLY — 16 items
BALLN TREK RX 2.5X15 (BALLOONS) ×3
BALLN TREK RX 3.0X20 (BALLOONS) ×3
BALLOON TREK RX 2.5X15 (BALLOONS) ×1 IMPLANT
BALLOON TREK RX 3.0X20 (BALLOONS) ×1 IMPLANT
CATH INFINITI 5FR ANG PIGTAIL (CATHETERS) ×3 IMPLANT
CATH INFINITI JR4 5F (CATHETERS) ×3 IMPLANT
CATH VISTA GUIDE 6FR XB3.5 (CATHETERS) ×3 IMPLANT
DEVICE CLOSURE MYNXGRIP 6/7F (Vascular Products) ×3 IMPLANT
DEVICE INFLAT 30 PLUS (MISCELLANEOUS) ×3 IMPLANT
KIT MANI 3VAL PERCEP (MISCELLANEOUS) ×3 IMPLANT
NEEDLE PERC 18GX7CM (NEEDLE) ×6 IMPLANT
PACK CARDIAC CATH (CUSTOM PROCEDURE TRAY) ×3 IMPLANT
SHEATH AVANTI 6FR X 11CM (SHEATH) ×3 IMPLANT
STENT XIENCE ALPINE RX 3.5X33 (Permanent Stent) ×3 IMPLANT
WIRE ASAHI PROWATER 180CM (WIRE) ×3 IMPLANT
WIRE EMERALD 3MM-J .035X150CM (WIRE) ×3 IMPLANT

## 2017-04-10 NOTE — ED Notes (Signed)
Cardiologist at bedside.  

## 2017-04-10 NOTE — Progress Notes (Signed)
Upon arrival Billy Walker was alone in ED. Chaplain offered [employer] of assurance.  He reported family enroute. Chaplain supported family as they arrived especially a cousin (first to arrive) who as previous grief. Chaplain left family who supported each other well.

## 2017-04-10 NOTE — H&P (Signed)
[place] [facility] [place] NAME: Billy Walker    MR#:  [id number]  DATE OF BIRTH:  1946/08/19  DATE OF ADMISSION:  04/10/2017  PRIMARY CARE PHYSICIAN: Dr Juluis Pitch  REQUESTING/REFERRING PHYSICIAN: Dr Isaias Cowman  CHIEF COMPLAINT:   Chief Complaint  Patient presents with  . Chest Pain    HISTORY OF PRESENT ILLNESS:  Billy Walker  is a 71 y.o. male with a known history of Hypertension. He was brought urgently to the cardiac cath lab today for a STEMI. He presented to the ER with chest pain that started about 4 PM. He described the pain as 8 out of 10 intensity as a pain 8 in the center of his chest. It radiated down his left arm. Nothing made the pain better or worse. The patient had a stent placed in the LAD. He feels better now. He just has a sensation 1 out of 10 in intensity in his left chest described as a soreness.  PAST MEDICAL HISTORY:   Past Medical History:  Diagnosis Date  . Hypertension   . Prostate cancer ([place])     PAST SURGICAL HISTORY:   Past Surgical History:  Procedure Laterality Date  . CHOLECYSTECTOMY    . PROSTATECTOMY      SOCIAL HISTORY:   Social History  Substance Use Topics  . Smoking status: Never Smoker  . Smokeless tobacco: Never Used  . Alcohol use No    FAMILY HISTORY:   Family History  Problem Relation Age of Onset  . Lung cancer Mother   . CAD Father     DRUG ALLERGIES:  No Known Allergies  REVIEW OF SYSTEMS:  CONSTITUTIONAL: No fever, fatigue or weakness.  EYES: No blurred or double vision.  EARS, NOSE, AND THROAT: No tinnitus or ear pain. No sore throat RESPIRATORY: No cough, Some shortness of breath. No wheezing or hemoptysis.  CARDIOVASCULAR: Positive for chest pain. No orthopnea, edema.  GASTROINTESTINAL: No nausea, vomiting, diarrhea or abdominal pain. No blood in bowel movements GENITOURINARY: No dysuria, hematuria.  ENDOCRINE: No polyuria, nocturia,   HEMATOLOGY: No anemia, easy bruising or bleeding SKIN: No rash or lesion. MUSCULOSKELETAL: No joint pain or arthritis.   NEUROLOGIC: No tingling, numbness, weakness.  PSYCHIATRY: No anxiety or depression.   MEDICATIONS AT HOME:   Prior to Admission medications   Medication Sig Start Date End Date Taking? Authorizing Provider  amLODipine (NORVASC) 10 MG tablet Take 10 mg by mouth daily. 02/18/17  Yes [provider]  aspirin EC 81 MG tablet Take 81 mg by mouth daily.   Yes [provider]      VITAL SIGNS:  Blood pressure 113/72, pulse 82, temperature 98.6 F (37 C), temperature source Oral, resp. rate 16, height 5\' 10"  (1.778 m), weight 104.3 kg (230 lb), SpO2 95 %.  PHYSICAL EXAMINATION:  GENERAL:  71 y.o.-year-old patient lying in the bed with no acute distress.  EYES: Pupils equal, round, reactive to light and accommodation. No scleral icterus. Extraocular muscles intact.  HEENT: Head atraumatic, normocephalic. Oropharynx and nasopharynx clear.  NECK:  Supple, no jugular venous distention. No thyroid enlargement, no tenderness.  LUNGS: Normal breath sounds bilaterally, no wheezing, rales,rhonchi or crepitation. No use of accessory muscles of respiration.  CARDIOVASCULAR: S1, S2 normal. No murmurs, rubs, or gallops.  ABDOMEN: Soft, nontender, nondistended. Bowel sounds present. No organomegaly or mass.  EXTREMITIES: No pedal edema, cyanosis, or clubbing.  NEUROLOGIC: Cranial nerves II through XII are intact. Muscle  strength 5/5 in all extremities. Sensation intact. Gait not checked.  PSYCHIATRIC: The patient is alert and oriented x 3.  SKIN: No rash, lesion, or ulcer.   LABORATORY PANEL:   CBC  Recent Labs Lab 04/10/17 1700  WBC 12.8*  HGB 14.9  HCT 42.9  PLT 286   ------------------------------------------------------------------------------------------------------------------  Chemistries   Recent Labs Lab 04/10/17 1740  NA 138  K 3.3*  CL  111  CO2 16*  GLUCOSE 166*  BUN 24*  CREATININE 1.84*  CALCIUM 9.2  AST 46*  ALT 16*  ALKPHOS 101  BILITOT 0.5   ------------------------------------------------------------------------------------------------------------------  Cardiac Enzymes  Recent Labs Lab 04/10/17 1919  TROPONINI 42.07*   ------------------------------------------------------------------------------------------------------------------  RADIOLOGY:  Dg Chest Port 1 View  Result Date: 04/10/2017 CLINICAL DATA:  Chest pain EXAM: PORTABLE CHEST 1 VIEW COMPARISON:  November 20, 2011 FINDINGS: There is scarring in the lung bases. Lungs elsewhere clear. Heart size and pulmonary vascularity are normal. No adenopathy. There is aortic atherosclerosis. No pneumothorax. No bone lesions. IMPRESSION: Bibasilar scarring. No edema or consolidation. Stable cardiac silhouette. There is aortic atherosclerosis. Aortic Atherosclerosis (ICD10-I70.0). Electronically Signed   By: Lowella Grip III M.D.   On: 04/10/2017 17:18    EKG:   Sinus rhythm 79 bpm, incomplete right bundle branch block. ST elevation in aVR, V1 and V2 and V3  IMPRESSION AND PLAN:   1. STEMI. Patient was taken urgently to the cardiac Cath Lab where a stent was placed in the LAD by Dr. Saralyn Pilar. The patient will be watched overnight in the ICU. Patient on Aggrastat drip. Patient on aspirin Brilinta, beta blocker and high intensity statin. Check a lipid profile in the a.m. Patient will be watched in the hospital to midnights. 2. Essential hypertension. Hold Norvasc at this time and patient is on beta blocker and ACE inhibitor. 3. Hyperlipidemia unspecified on atorvastatin 4. Chronic kidney disease stage III. Watch closely after cardiac catheterization  5. History of prostate cancer  All the records are reviewed and case discussed with ED provider. Management plans discussed with the patient, family and they are in agreement.  CODE STATUS: Full  code  TOTAL TIME TAKING CARE OF THIS PATIENT: 50 minutes.    Loletha Grayer M.D on 04/10/2017 at 8:44 PM  Between 7am to 6pm - Pager - [phone]  After 6pm call admission pager [phone]  [facility]  [phone]  CC: Primary care physician; Dr Juluis Pitch

## 2017-04-10 NOTE — ED Notes (Signed)
Family at bedside. 

## 2017-04-10 NOTE — ED Notes (Signed)
Called patient's friend and HCPOA in demographics tab

## 2017-04-10 NOTE — ED Provider Notes (Signed)
[facility] Emergency Department Provider Note   ____________________________________________    I have reviewed the triage vital signs and the nursing notes.   HISTORY  Chief Complaint Chest Pain     HPI Billy Walker is a 71 y.o. male who presents with chest pain. Patient reports moderate to severe central chest pain which is pressure-like in nature. He reports some radiation to his left arm. This started at 4 PM while he was at rest. He has never had this before. Denies a history of coronary artery disease. Does not smoke. No recent travel. No calf pain or swelling. Has not taken anything for this. EMS gave him nitroglycerin and had some relief briefly.   Past Medical History:  Diagnosis Date  . Hypertension     There are no active problems to display for this patient.   Past Surgical History:  Procedure Laterality Date  . CHOLECYSTECTOMY    . PROSTATECTOMY      Prior to Admission medications   Medication Sig Start Date End Date Taking? Authorizing Provider  amLODipine (NORVASC) 10 MG tablet Take 10 mg by mouth daily. 02/18/17  Yes [provider]  aspirin EC 81 MG tablet Take 81 mg by mouth daily.   Yes [provider]     Allergies Patient has no known allergies.  No family history on file.  Social History Social History  Substance Use Topics  . Smoking status: Never Smoker  . Smokeless tobacco: Never Used  . Alcohol use No    Review of Systems  Constitutional: No Dizziness Eyes: No visual changes.  ENT: No neck pain Cardiovascular: As above Respiratory: Denies shortness of breath. Gastrointestinal: No abdominal pain.  No nausea, no vomiting.   Genitourinary: Negative for dysuria. Musculoskeletal: Negative for back pain. Skin: Negative for rash. Neurological: Negative for headaches   ____________________________________________   PHYSICAL EXAM:  VITAL SIGNS: ED Triage Vitals  Enc Vitals  Group     BP 04/10/17 1658 (!) 142/90     Pulse Rate 04/10/17 1658 86     Resp 04/10/17 1658 18     Temp 04/10/17 1658 97.8 F (36.6 C)     Temp Source 04/10/17 1658 Oral     SpO2 04/10/17 1658 99 %     Weight 04/10/17 1703 104.3 kg (230 lb)     Height 04/10/17 1703 1.778 m (5\' 10" )     Head Circumference --      Peak Flow --      Pain Score 04/10/17 1657 6     Pain Loc --      Pain Edu? --      Excl. in [place]? --     Constitutional: Alert and oriented. Moderate distress Eyes: Conjunctivae are normal.  Head: Atraumatic.  Mouth/Throat: Mucous membranes are moist.    Cardiovascular: Normal rate, regular rhythm. Grossly normal heart sounds.  Good peripheral circulation. Respiratory: Normal respiratory effort.  No retractions. Lungs CTAB. Gastrointestinal: Soft and nontender. No distention.  No CVA tenderness. Genitourinary: deferred Musculoskeletal: No lower extremity tenderness nor edema.  Warm and well perfused Neurologic:  Normal speech and language. No gross focal neurologic deficits are appreciated.  Skin:  Skin is warm, dry and intact. No rash noted. Psychiatric: Mood and affect are normal. Speech and behavior are normal.  ____________________________________________   LABS (all labs ordered are listed, but only abnormal results are displayed)  Labs Reviewed  CBC WITH DIFFERENTIAL/PLATELET - Abnormal; Notable for the following:  Result Value   WBC 12.8 (*)    Neutro Abs 7.5 (*)    Lymphs Abs 4.0 (*)    Basophils Absolute 0.2 (*)    All other components within normal limits  COMPREHENSIVE METABOLIC PANEL - Abnormal; Notable for the following:    CO2 18 (*)    Glucose, Bld 167 (*)    BUN 24 (*)    Creatinine, Ser 2.00 (*)    AST 52 (*)    GFR calc non Af Amer 32 (*)    GFR calc Af Amer 37 (*)    All other components within normal limits  LIPID PANEL - Abnormal; Notable for the following:    Triglycerides 182 (*)    HDL 34 (*)    LDL Cholesterol 125 (*)     All other components within normal limits  APTT - Abnormal; Notable for the following:    aPTT 115 (*)    All other components within normal limits  TROPONIN I  PROTIME-INR  COMPREHENSIVE METABOLIC PANEL   ____________________________________________  EKG  ED ECG REPORT I, Lavonia Drafts, the attending physician, personally viewed and interpreted this ECG.  Date: 04/10/2017  Rhythm: normal sinus rhythm QRS Axis: normal Intervals: normal ST/T Wave abnormalities: ST elevation in V1, hyperacute T waves in V2 V3 V4 Narrative Interpretation: Concerning for STEMI  ED ECG REPORT I, Lavonia Drafts, the attending physician, personally viewed and interpreted this ECG.  Date: 04/10/2017  Rhythm: normal sinus rhythm QRS Axis: normal Intervals: normal ST/T Wave abnormalities: ST elevation in V1, mild elevation in V2 V3 Narrative Interpretation: Progressing STEMI   ____________________________________________  RADIOLOGY  Chest x-ray unremarkable ____________________________________________   PROCEDURES  Procedure(s) performed: No    Critical Care performed: yes  CRITICAL CARE Performed by: Lavonia Drafts   Total critical care time: 15 minutes  Critical care time was exclusive of separately billable procedures and treating other patients.  Critical care was necessary to treat or prevent imminent or life-threatening deterioration.  Critical care was time spent personally by me on the following activities: development of treatment plan with patient and/or surrogate as well as nursing, discussions with consultants, evaluation of patient's response to treatment, examination of patient, obtaining history from patient or surrogate, ordering and performing treatments and interventions, ordering and review of laboratory studies, ordering and review of radiographic studies, pulse oximetry and re-evaluation of patient's  condition. ____________________________________________   INITIAL IMPRESSION / ASSESSMENT AND PLAN / ED COURSE  Pertinent labs & imaging results that were available during my care of the patient were reviewed by me and considered in my medical decision making (see chart for details).  Patient presents with significant chest pain that started 1 hour ago. EKG is highly concerning, code STEMI called. Discussed with Dr. Saralyn Pilar who will take the patient to the lab. Heparin bolus and aspirin given. Discussed with hospitalist service for admission as well    ____________________________________________   FINAL CLINICAL IMPRESSION(S) / ED DIAGNOSES  ST elevation Myocardial Infarction   NEW MEDICATIONS STARTED DURING THIS VISIT:  Current Discharge Medication List       Note:  This document was prepared using Dragon voice recognition software and may include unintentional dictation errors.    Lavonia Drafts, MD 04/10/17 [phone]

## 2017-04-10 NOTE — ED Notes (Signed)
ED Provider at bedside. 

## 2017-04-10 NOTE — Consult Note (Signed)
Pharmacy consulted for tirofiban (aggrastat) dosing. Pt received a bolus of 6207.48mcg in that cath lab and drip was started at 0.071mcg/kg/min. Continue drip at this rate for 18 hr  Taneasha Fuqua D Donnita Farina, Pharm.D, BCPS Clinical Pharmacist

## 2017-04-10 NOTE — Progress Notes (Signed)
[facility] Progress Note Patient Name: Billy Walker DOB: Oct 18, 1945 MRN: [id number]   Date of Service  04/10/2017  HPI/Events of Note  71 yr old male presented with chest pain and found to have evidence of STEMI.  S/P stent placement.  Doing well from hemodynamic and resp standpoint.  eICU Interventions  Chart reviewed     Intervention Category Evaluation Type: New Patient Evaluation  Mauri Brooklyn, P 04/10/2017, 7:58 PM

## 2017-04-10 NOTE — ED Triage Notes (Signed)
Patient arrived in obvious distress c/o 5/10 Cp in left with no radiation.  Patient was also slightly diaphoretic.  He took 4 81mg  chewable ASA at home PTA, and EMS gave pt 1 nitro SL PTA.  EMS reports 1o BP of 130/90, 80's pulse, and 184 glu with 20LAC access established.  Patient is AOx4.  Pt also states since the SL Nitro, his pain has went from an 8/10 to a 5/10.

## 2017-04-10 NOTE — Consult Note (Signed)
PULMONARY / CRITICAL CARE MEDICINE   Name: DAYLAN JUHNKE MRN: [id number] DOB: 09/28/1945    ADMISSION DATE:  04/10/2017   CONSULTATION DATE: 04/10/2017  REFERRING MD:  Dr. Leslye Peer  Reason: ICU management, status posts cardiac catheterization  CHIEF COMPLAINT:  Chest pain  HISTORY OF PRESENT ILLNESS:   This is a 71 year old Caucasian male with a medical history as indicated below who presented to the ED with complaints of chest pain. He was ruled in for STEMI and taken emergently to the cath lab. He had a stent placed in the mid LAD. He is currently awake and complaining of mild chest discomfort, which he rates as a 2 on 10. He denies nausea, vomiting, dizziness and headache.   PAST MEDICAL HISTORY :  He  has a past medical history of Hypertension and Prostate cancer ([place]).  PAST SURGICAL HISTORY: He  has a past surgical history that includes Cholecystectomy and Prostatectomy.  No Known Allergies  No current facility-administered medications on file prior to encounter.    No current outpatient prescriptions on file prior to encounter.    FAMILY HISTORY:  His indicated that his mother is deceased. He indicated that his father is deceased.    SOCIAL HISTORY: He  reports that he has never smoked. He has never used smokeless tobacco. He reports that he does not drink alcohol or use drugs.  REVIEW OF SYSTEMS:   Constitutional: Negative for fever and chills.  HENT: Negative for congestion and rhinorrhea.  Eyes: Negative for redness and visual disturbance.  Respiratory: Negative for shortness of breath and wheezing.  Cardiovascular: Positive for mild chest discomfort but negative for palpitations.  Gastrointestinal: Negative  for nausea , vomiting and abdominal pain and  Loose stools Genitourinary: Negative for dysuria and urgency.  Endocrine: Denies polyuria, polyphagia and heat intolerance Musculoskeletal: Negative for myalgias and arthralgias.  Skin: Negative for pallor  and wound.  Neurological: Negative for dizziness and headaches   SUBJECTIVE:   VITAL SIGNS: BP 123/75   Pulse 73   Temp 98.6 F (37 C) (Oral)   Resp 17   Ht 5\' 11"  (1.803 m)   Wt 226 lb 3.1 oz (102.6 kg)   SpO2 97%   BMI 31.55 kg/m   HEMODYNAMICS:    VENTILATOR SETTINGS:    INTAKE / OUTPUT: No intake/output data recorded.  PHYSICAL EXAMINATION: General:  No acute distress Neuro:  Alert and oriented 4, no focal deficits HEENT:  PERRLA, trachea midline, oral mucosa moist and pink Cardiovascular:  RRR, S1, S2 normal, no murmur, regurg or gallop, +2 pulses, no edema Lungs:  Clear to auscultation bilaterally Abdomen:  Soft, nontender, nondistended, normal bowel sounds Musculoskeletal:  No deformities, no joint swelling Skin:  Warm and dry, right groin angiogram site dry and intact  LABS:  BMET  Recent Labs Lab 04/10/17 1700 04/10/17 1740  NA 138 138  K 3.7 3.3*  CL 109 111  CO2 18* 16*  BUN 24* 24*  CREATININE 2.00* 1.84*  GLUCOSE 167* 166*    Electrolytes  Recent Labs Lab 04/10/17 1700 04/10/17 1740  CALCIUM 9.7 9.2    CBC  Recent Labs Lab 04/10/17 1700  WBC 12.8*  HGB 14.9  HCT 42.9  PLT 286    Coag's  Recent Labs Lab 04/10/17 1700  APTT 115*  INR 1.07    Sepsis Markers No results for input(s): LATICACIDVEN, PROCALCITON, O2SATVEN in the last 168 hours.  ABG No results for input(s): PHART, PCO2ART, PO2ART in the last 168  hours.  Liver Enzymes  Recent Labs Lab 04/10/17 1700 04/10/17 1740  AST 52* 46*  ALT 17 16*  ALKPHOS 108 101  BILITOT 0.9 0.5  ALBUMIN 4.2 3.8    Cardiac Enzymes  Recent Labs Lab 04/10/17 1700 04/10/17 1919  TROPONINI <0.03 42.07*    Glucose  Recent Labs Lab 04/10/17 1916  GLUCAP 121*    Imaging Dg Chest Port 1 View  Result Date: 04/10/2017 CLINICAL DATA:  Chest pain EXAM: PORTABLE CHEST 1 VIEW COMPARISON:  November 20, 2011 FINDINGS: There is scarring in the lung bases. Lungs elsewhere  clear. Heart size and pulmonary vascularity are normal. No adenopathy. There is aortic atherosclerosis. No pneumothorax. No bone lesions. IMPRESSION: Bibasilar scarring. No edema or consolidation. Stable cardiac silhouette. There is aortic atherosclerosis. Aortic Atherosclerosis (ICD10-I70.0). Electronically Signed   By: Lowella Grip III M.D.   On: 04/10/2017 17:18    STUDIES:   Left Heart Catheterization  Mid RCA lesion, 75 %stenosed.  Dist RCA lesion, 50 %stenosed.  3rd Mrg-2 lesion, 30 %stenosed.  3rd Mrg-1 lesion, 30 %stenosed.  4th Mrg lesion, 30 %stenosed.  A STENT XIENCE ALPINE RX [id number] drug eluting stent was successfully placed, and does not overlap previously placed stent.  Mid LAD lesion, 100 %stenosed.  Post intervention, there is a 0% residual stenosis.   1. Two-vessel coronary artery disease with acutely occluded proximal LAD and 75% stenosis mid RCA 2. Mildly reduced left ventricular function with apical wall akinesis 3. Successful PCI with DES proximal LAD  SIGNIFICANT EVENTS: 08/19: ADMITTED  LINES/TUBES: PIVs Aline Rt femoral sheath  DISCUSSION: 71 y/o male admitted with STEMI now S/P cardiac catheterization with DUS placement in the LAD   ASSESSMENT  STEMI s/p stent placement in the LAD H/O HYPERTENSION  PLAN Hemodynamic monitoring per ICU protocol Cardiology following ASA, statin, Beta blocker, nitroglycerin, ACEI, Aggrastat and brilinta Morphine prn for pain EKG daily and prn Trend cardiac enzymes  FAMILY  - Updates: Patient updated on current treatment plan.   - Inter-disciplinary family meet or Palliative Care meeting due by:  day 7    Kaydyn Chism S. [facility] ANP-BC Pulmonary and Critical Care Medicine [facility] Pager [phone] or [phone]  04/10/2017, 10:06 PM

## 2017-04-10 NOTE — ED Notes (Signed)
Sending blue top to lab

## 2017-04-11 ENCOUNTER — Inpatient Hospital Stay
Admit: 2017-04-11 | Discharge: 2017-04-11 | Disposition: A | Discharge status: Home / Self Care | Payer: Medicare Other | Attending: Adult Health | Admitting: Adult Health

## 2017-04-11 ENCOUNTER — Encounter: Payer: Self-pay | Admitting: *Deleted

## 2017-04-11 DIAGNOSIS — I10 Essential (primary) hypertension: Secondary | ICD-10-CM

## 2017-04-11 LAB — CBC
HEMATOCRIT: 40 % (ref 40.0–52.0)
HEMOGLOBIN: 14.2 g/dL (ref 13.0–18.0)
MCH: 29.6 pg (ref 26.0–34.0)
MCHC: 35.5 g/dL (ref 32.0–36.0)
MCV: 83.3 fL (ref 80.0–100.0)
PLATELETS: 251 10*3/uL (ref 150–440)
RBC: 4.8 MIL/uL (ref 4.40–5.90)
RDW: 14.5 % (ref 11.5–14.5)
WBC: 12.8 10*3/uL — ABNORMAL HIGH (ref 3.8–10.6)

## 2017-04-11 LAB — LIPID PANEL
CHOLESTEROL: 171 mg/dL (ref 0–200)
HDL: 29 mg/dL — AB (ref >40)
LDL CALC: 115 mg/dL — AB (ref 0–99)
TRIGLYCERIDES: 133 mg/dL (ref <150)
Total CHOL/HDL Ratio: 5.9 RATIO
VLDL: 27 mg/dL (ref 0–40)

## 2017-04-11 LAB — MAGNESIUM: Magnesium: 2.1 mg/dL (ref 1.7–2.4)

## 2017-04-11 LAB — BASIC METABOLIC PANEL
Anion gap: 6 (ref 5–15)
BUN: 20 mg/dL (ref 6–20)
CHLORIDE: 111 mmol/L (ref 101–111)
CO2: 21 mmol/L — AB (ref 22–32)
CREATININE: 1.59 mg/dL — AB (ref 0.61–1.24)
Calcium: 8.9 mg/dL (ref 8.9–10.3)
GFR calc non Af Amer: 42 mL/min — ABNORMAL LOW (ref >60)
GFR, EST AFRICAN AMERICAN: 49 mL/min — AB (ref >60)
Glucose, Bld: 132 mg/dL — ABNORMAL HIGH (ref 65–99)
Potassium: 4 mmol/L (ref 3.5–5.1)
Sodium: 138 mmol/L (ref 135–145)

## 2017-04-11 LAB — TROPONIN I
Troponin I: >65 ng/mL (ref <0.03)
Troponin I: >65 ng/mL (ref <0.03)
Troponin I: >65 ng/mL (ref <0.03)

## 2017-04-11 LAB — PHOSPHORUS: Phosphorus: 3.6 mg/dL (ref 2.5–4.6)

## 2017-04-11 MED ORDER — METOPROLOL TARTRATE 25 MG PO TABS
25.0000 mg | ORAL_TABLET | Freq: Two times a day (BID) | ORAL | Status: DC
  Administered 2017-04-11 – 2017-04-12 (×3): 25 mg via ORAL
  Filled 2017-04-11 (×3): qty 1

## 2017-04-11 NOTE — Progress Notes (Signed)
Alerted Ms. Patria Mane, NP to pt.'s troponin >65.  Pt. Having chest pain not relieved by nitro and not reproducible.  Ms. Patria Mane, NP ordered STAT EKG and instructed RN to alert Dr. Saralyn Pilar. Dr. Saralyn Pilar was paged, discussed pt.'s current status. Elevated troponins expected, will continue to monitor pt. Closely.

## 2017-04-11 NOTE — Progress Notes (Signed)
CRITICAL VALUE ALERT  Critical Value:  Troponin 42.07  Date & Time Notified:  04/10/17 @ 2003  Provider Notified: Ms. Patria Mane, NP @ 2015  Orders Received/Actions taken: value expected to be elevated, will continue to monitor pt. Closely.  Will alert NP if troponin is > 65 per instructions

## 2017-04-11 NOTE — Progress Notes (Signed)
PULMONARY / CRITICAL CARE MEDICINE   Name: Billy Walker MRN: [id number] DOB: [birth year]    ADMISSION DATE:  04/10/2017   CONSULTATION DATE: 04/10/2017  REFERRING MD:  Dr. Leslye Peer  Reason: ICU management, status posts cardiac catheterization  CHIEF COMPLAINT:  Chest pain  HISTORY OF PRESENT ILLNESS:   This is a 71 year old Caucasian male with a medical history as indicated below who presented to the ED with complaints of chest pain. He was ruled in for STEMI and taken emergently to the cath lab. He had a stent placed in the mid LAD. He is currently awake and complaining of mild chest discomfort, which he rates as a 2 on 10. He denies nausea, vomiting, dizziness and headache.   SUBJECTIVE: No acute issues overnight. Denies chest pain, nausea, vomiting and dyspnea.   VITAL SIGNS: BP 119/74   Pulse 64   Temp 98.2 F (36.8 C) (Oral)   Resp 16   Ht 5\' 11"  (1.803 m)   Wt 226 lb 3.1 oz (102.6 kg)   SpO2 94%   BMI 31.55 kg/m   HEMODYNAMICS:    VENTILATOR SETTINGS:    INTAKE / OUTPUT: I/O last 3 completed shifts: In: 1224.2 [I.V.:1224.2] Out: 4098 [Urine:1845]  PHYSICAL EXAMINATION: General:  No acute distress Neuro:  Alert and oriented 4, no focal deficits HEENT:  PERRLA, trachea midline, oral mucosa moist and pink Cardiovascular:  RRR, S1, S2 normal, no murmur, regurg or gallop, +2 pulses, no edema Lungs:  Clear to auscultation bilaterally Abdomen:  Soft, nontender, nondistended, normal bowel sounds Musculoskeletal:  No deformities, no joint swelling Skin:  Warm and dry, right groin angiogram site dry and intact  LABS:  BMET  Recent Labs Lab 04/10/17 1700 04/10/17 1740 04/11/17 0700  NA 138 138 138  K 3.7 3.3* 4.0  CL 109 111 111  CO2 18* 16* 21*  BUN 24* 24* 20  CREATININE 2.00* 1.84* 1.59*  GLUCOSE 167* 166* 132*    Electrolytes  Recent Labs Lab 04/10/17 1700 04/10/17 1740 04/11/17 0101 04/11/17 0700  CALCIUM 9.7 9.2  --  8.9  MG  --   --   2.1  --   PHOS  --   --  3.6  --     CBC  Recent Labs Lab 04/10/17 1700 04/11/17 0700  WBC 12.8* 12.8*  HGB 14.9 14.2  HCT 42.9 40.0  PLT 286 251    Coag's  Recent Labs Lab 04/10/17 1700  APTT 115*  INR 1.07    Sepsis Markers No results for input(s): LATICACIDVEN, PROCALCITON, O2SATVEN in the last 168 hours.  ABG No results for input(s): PHART, PCO2ART, PO2ART in the last 168 hours.  Liver Enzymes  Recent Labs Lab 04/10/17 1700 04/10/17 1740  AST 52* 46*  ALT 17 16*  ALKPHOS 108 101  BILITOT 0.9 0.5  ALBUMIN 4.2 3.8    Cardiac Enzymes  Recent Labs Lab 04/10/17 1919 04/11/17 0101 04/11/17 0700  TROPONINI 42.07* >65.00* >65.00*    Glucose  Recent Labs Lab 04/10/17 1916  GLUCAP 121*    Imaging Dg Chest Port 1 View  Result Date: 04/10/2017 CLINICAL DATA:  Chest pain EXAM: PORTABLE CHEST 1 VIEW COMPARISON:  November 20, 2011 FINDINGS: There is scarring in the lung bases. Lungs elsewhere clear. Heart size and pulmonary vascularity are normal. No adenopathy. There is aortic atherosclerosis. No pneumothorax. No bone lesions. IMPRESSION: Bibasilar scarring. No edema or consolidation. Stable cardiac silhouette. There is aortic atherosclerosis. Aortic Atherosclerosis (ICD10-I70.0). Electronically Signed  By: Lowella Grip III M.D.   On: 04/10/2017 17:18    STUDIES:   Left Heart Catheterization  Mid RCA lesion, 75 %stenosed.  Dist RCA lesion, 50 %stenosed.  3rd Mrg-2 lesion, 30 %stenosed.  3rd Mrg-1 lesion, 30 %stenosed.  4th Mrg lesion, 30 %stenosed.  A STENT XIENCE ALPINE RX 3.5X33 drug eluting stent was successfully placed, and does not overlap previously placed stent.  Mid LAD lesion, 100 %stenosed.  Post intervention, there is a 0% residual stenosis.   1. Two-vessel coronary artery disease with acutely occluded proximal LAD and 75% stenosis mid RCA 2. Mildly reduced left ventricular function with apical wall akinesis 3. Successful  PCI with DES proximal LAD  SIGNIFICANT EVENTS: 08/19: ADMITTED  LINES/TUBES: PIVs Aline Rt femoral sheath  DISCUSSION: 71 y/o male admitted with STEMI now S/P cardiac catheterization with DUS placement in the LAD   ASSESSMENT  STEMI s/p stent placement in the LAD H/O HYPERTENSION  PLAN Hemodynamic monitoring per ICU protocol Cardiology following ASA, statin, Beta blocker, nitroglycerin, ACEI, Aggrastat and brilinta Morphine prn for pain EKG daily and prn Trend cardiac enzymes  FAMILY  - Updates: Patient updated on current treatment plan.   - Inter-disciplinary family meet or Palliative Care meeting due by:  day 7    Magdalene S. [facility] ANP-BC Pulmonary and Critical Care Medicine [facility] Pager [phone] or [phone]  04/11/2017, 1:03 AM   Uncomplicated course post LHC. Transfer to telemetry. PCCM will sign off. Please call if we can be of further assistance.  Merton Border, MD PCCM service Mobile [phone] Pager [phone] 04/11/2017 12:49 PM

## 2017-04-11 NOTE — Progress Notes (Signed)
CH made initial visit on referral of [facility] [place]. Pt stated he was having chest pains and feels it was a heart attach. Pt stated he was much better than yesterday. [place] engaged the Pt in conversations around "[place]". Pt stated that he will be transferred to room 256 and feels this is an improvement. CH will inform unit CH of this move. CH is available for follow up as needed.    04/11/17 1300  Clinical Encounter Type  Visited With Patient;Health care provider  Visit Type Initial;Spiritual support;Critical Care  Referral From Chaplain  Consult/Referral To Chaplain  Spiritual Encounters  Spiritual Needs Prayer;Emotional

## 2017-04-11 NOTE — Plan of Care (Signed)
Problem: Activity: Goal: Risk for activity intolerance will decrease Outcome: Progressing Up to bathroom with standby assistance, tolerated well.

## 2017-04-11 NOTE — Progress Notes (Signed)
VSS O/N, troponin trending up, femoral site level 0 C/o pain intermittently throughout the night. aggrastat gtt running until 1230 today. Pt. A&O x4, 2 L [place] for comfort, no care concerns at this time. Report given to [facility]

## 2017-04-11 NOTE — Consult Note (Signed)
[facility] Cardiology  CARDIOLOGY CONSULT NOTE  Patient ID: Billy Walker MRN: [id number] DOB/AGE: Jan 26, 1946 71 y.o.  Admit date: 04/10/2017 Referring Physician Corky Downs Primary Physician [facility] - Closed Primary Cardiologist  Reason for Consultation Anterior ST elevation myocardial infarction  HPI: 71 year old gentleman referred for evaluation for anterior ST elevation myocardial infarction. The patient presented to [facility] emergency room on 04/10/2017 with 8 out of 10 chest pain. ECG revealed ST elevations in leads V1 through V5 consistent with anterior ST elevation myocardial infarction. The patient was brought urgently to the cardiac catheterization which revealed occluded proximal LAD, and 75% stenosis mid RCA. The patient underwent primary PCI receiving a 3.5 x 33 mm drug-eluting stent with an excellent angiographic result. Left ventriculography revealed moderately reduced left ventricular function with apical wall akinesis. Postprocedural ECG revealed resolution of diagnostic ST elevations. The patient was troponin of 42.07, greater than 652. The patient reports doing well this morning, with a mild, ill-defined discomfort across his chest unlike what he experienced last night during acute MI.  Review of systems complete and found to be negative unless listed above     Past Medical History:  Diagnosis Date  . Hypertension   . Prostate cancer [facility])     Past Surgical History:  Procedure Laterality Date  . CHOLECYSTECTOMY    . CORONARY STENT INTERVENTION N/A 04/10/2017   Procedure: CORONARY STENT INTERVENTION;  Surgeon: Isaias Cowman, MD;  Location: [place];  Service: Cardiovascular;  Laterality: N/A;  . LEFT HEART CATH AND CORONARY ANGIOGRAPHY N/A 04/10/2017   Procedure: LEFT HEART CATH AND CORONARY ANGIOGRAPHY;  Surgeon: Isaias Cowman, MD;  Location: [place] [facility];  Service: Cardiovascular;  Laterality: N/A;  . PROSTATECTOMY      Prescriptions Prior to Admission   Medication Sig Dispense Refill Last Dose  . amLODipine (NORVASC) 10 MG tablet Take 10 mg by mouth daily.   04/10/2017 at Unknown time  . aspirin EC 81 MG tablet Take 81 mg by mouth daily.   04/10/2017 at Unknown time   Social History   Social History  . Marital status: Single    Spouse name: N/A  . Number of children: N/A  . Years of education: N/A   Occupational History  . Not on file.   Social History Main Topics  . Smoking status: Never Smoker  . Smokeless tobacco: Never Used  . Alcohol use No  . Drug use: No  . Sexual activity: Not on file   Other Topics Concern  . Not on file   Social History Narrative  . No narrative on file    Family History  Problem Relation Age of Onset  . Lung cancer Mother   . CAD Father       Review of systems complete and found to be negative unless listed above      PHYSICAL EXAM  General: Well developed, well nourished, in no acute distress HEENT:  Normocephalic and atramatic Neck:  No JVD.  Lungs: Clear bilaterally to auscultation and percussion. Heart: HRRR . Normal S1 and S2 without gallops or murmurs.  Abdomen: Bowel sounds are positive, abdomen soft and non-tender  Msk:  Back normal, normal gait. Normal strength and tone for age. Extremities: No clubbing, cyanosis or edema.   Neuro: Alert and oriented X 3. Psych:  Good affect, responds appropriately  Labs:   Lab Results  Component Value Date   WBC 12.8 (H) 04/11/2017   HGB 14.2 04/11/2017   HCT 40.0 04/11/2017   MCV 83.3 04/11/2017  PLT 251 04/11/2017    Recent Labs Lab 04/10/17 1740 04/11/17 0700  NA 138 138  K 3.3* 4.0  CL 111 111  CO2 16* 21*  BUN 24* 20  CREATININE 1.84* 1.59*  CALCIUM 9.2 8.9  PROT 7.4  --   BILITOT 0.5  --   ALKPHOS 101  --   ALT 16*  --   AST 46*  --   GLUCOSE 166* 132*   Lab Results  Component Value Date   CKTOTAL 67 11/20/2011   CKMB 0.5 11/20/2011   TROPONINI >65.00 (HH) 04/11/2017    Lab Results  Component Value  Date   CHOL 171 04/11/2017   CHOL 195 04/10/2017   Lab Results  Component Value Date   HDL 29 (L) 04/11/2017   HDL 34 (L) 04/10/2017   Lab Results  Component Value Date   LDLCALC 115 (H) 04/11/2017   LDLCALC 125 (H) 04/10/2017   Lab Results  Component Value Date   TRIG 133 04/11/2017   TRIG 182 (H) 04/10/2017   Lab Results  Component Value Date   CHOLHDL 5.9 04/11/2017   CHOLHDL 5.7 04/10/2017   No results found for: LDLDIRECT    Radiology: Dg Chest Port 1 View  Result Date: 04/10/2017 CLINICAL DATA:  Chest pain EXAM: PORTABLE CHEST 1 VIEW COMPARISON:  November 20, 2011 FINDINGS: There is scarring in the lung bases. Lungs elsewhere clear. Heart size and pulmonary vascularity are normal. No adenopathy. There is aortic atherosclerosis. No pneumothorax. No bone lesions. IMPRESSION: Bibasilar scarring. No edema or consolidation. Stable cardiac silhouette. There is aortic atherosclerosis. Aortic Atherosclerosis (ICD10-I70.0). Electronically Signed   By: Lowella Grip III M.D.   On: 04/10/2017 17:18    EKG: Normal sinus rhythm with ST elevations V1 through V5, resolved after PCI  ASSESSMENT AND PLAN:   1. Anterior STEMI, status post primary PCI, with DES proximal LAD, with residual 75% stenosis mid RCA 2. Essential hypertension  Recommendations  1. Continue current medications 2. Uptitrate metoprolol 25 mg twice a day 3. 2-D echocardiogram 4. May transfer to telemetry  Signed: Isaias Cowman MD,PhD, [facility] 04/11/2017, 8:56 AM

## 2017-04-11 NOTE — Care Management (Addendum)
Admitted with stemi requiring emergent cath resulting in PCI.  Transferred to 2A 8/20.  Patient to discharge on Brilinta.  Verbally confirmed pharmacy coverage with insurance.  Provided with 30 day coupon

## 2017-04-11 NOTE — Progress Notes (Signed)
[place] at [place] NAME: Billy Walker    MR#:  [id number]  DATE OF BIRTH:  [birth year]  SUBJECTIVE:   Had on/off chest Discomfort overnight. Currently not having any symptoms.  REVIEW OF SYSTEMS:    Review of Systems  Constitutional: Negative for fever, chills weight loss HENT: Negative for ear pain, nosebleeds, congestion, facial swelling, rhinorrhea, neck pain, neck stiffness and ear discharge.   Respiratory: Negative for cough, shortness of breath, wheezing  Cardiovascular: Negative for chest pain, palpitations and leg swelling.  Gastrointestinal: Negative for heartburn, abdominal pain, vomiting, diarrhea or consitpation Genitourinary: Negative for dysuria, urgency, frequency, hematuria Musculoskeletal: Negative for back pain or joint pain Neurological: Negative for dizziness, seizures, syncope, focal weakness,  numbness and headaches.  Hematological: Does not bruise/bleed easily.  Psychiatric/Behavioral: Negative for hallucinations, confusion, dysphoric mood    Tolerating Diet: yes      DRUG ALLERGIES:  No Known Allergies  VITALS:  Blood pressure 105/75, pulse (!) 57, temperature 98.2 F (36.8 C), temperature source Oral, resp. rate 20, height 5\' 11"  (1.803 m), weight 102.6 kg (226 lb 3.1 oz), SpO2 96 %.  PHYSICAL EXAMINATION:  Constitutional: Appears well-developed and well-nourished. No distress. HENT: Normocephalic. Marland Kitchen Oropharynx is clear and moist.  Eyes: Conjunctivae and EOM are normal. PERRLA, no scleral icterus.  Neck: Normal ROM. Neck supple. No JVD. No tracheal deviation. CVS: RRR, S1/S2 +, no murmurs, no gallops, no carotid bruit.  Pulmonary: Effort and breath sounds normal, no stridor, rhonchi, wheezes, rales.  Abdominal: Soft. BS +,  no distension, tenderness, rebound or guarding.  Musculoskeletal: Normal range of motion. No edema and no tenderness.  Neuro: Alert. CN 2-12 grossly intact. No focal  deficits. Skin: Skin is warm and dry. No rash noted. Groin site without erythema/ecchymosis Psychiatric: Normal mood and affect.      LABORATORY PANEL:   CBC  Recent Labs Lab 04/11/17 0700  WBC 12.8*  HGB 14.2  HCT 40.0  PLT 251   ------------------------------------------------------------------------------------------------------------------  Chemistries   Recent Labs Lab 04/10/17 1740 04/11/17 0101  NA 138  --   K 3.3*  --   CL 111  --   CO2 16*  --   GLUCOSE 166*  --   BUN 24*  --   CREATININE 1.84*  --   CALCIUM 9.2  --   MG  --  2.1  AST 46*  --   ALT 16*  --   ALKPHOS 101  --   BILITOT 0.5  --    ------------------------------------------------------------------------------------------------------------------  Cardiac Enzymes  Recent Labs Lab 04/10/17 1700 04/10/17 1919 04/11/17 0101  TROPONINI <0.03 42.07* >65.00*   ------------------------------------------------------------------------------------------------------------------  RADIOLOGY:  Dg Chest Port 1 View  Result Date: 04/10/2017 CLINICAL DATA:  Chest pain EXAM: PORTABLE CHEST 1 VIEW COMPARISON:  November 20, 2011 FINDINGS: There is scarring in the lung bases. Lungs elsewhere clear. Heart size and pulmonary vascularity are normal. No adenopathy. There is aortic atherosclerosis. No pneumothorax. No bone lesions. IMPRESSION: Bibasilar scarring. No edema or consolidation. Stable cardiac silhouette. There is aortic atherosclerosis. Aortic Atherosclerosis (ICD10-I70.0). Electronically Signed   By: Lowella Grip III M.D.   On: 04/10/2017 17:18     ASSESSMENT AND PLAN:    71 year old male with history of essential hypertension who presented with chest pain and found to have ST elevation MI.  1. ST elevation MI: Patient was immediately sent to cardiac catheterization lab from ED. Cath shows Two-vessel coronary artery disease with acutely occluded  proximal LAD and 75% stenosis mid RCA With   Mildly reduced left ventricular function with apical wall akinesis and Successful PCI with DES proximal LAD Continue cardiac monitoring Continue aspirin, atorvastatin, enalapril, metoprolol, nitroglycerin sublingual and brillanta. Will need cardiac rehabilitation at discharge  2. Essential hypertension: Continue metoprolol and enalapril  3. Hyperlipidemia: Continue statin LDL 125 with goal less than 70   Management plans discussed with the patient and he is in agreement.  CODE STATUS: full  TOTAL TIME TAKING CARE OF THIS PATIENT: 30 minutes.     POSSIBLE D/C tomorrow, DEPENDING ON CLINICAL CONDITION.   Billy Walker M.D on 04/11/2017 at 7:41 AM  Between 7am to 6pm - Pager - [phone] After 6pm go to [email or web address] - password [email or web address] [place] [facility]  Office  [phone]  CC: Primary care physician; No primary care provider on file.  Note: This dictation was prepared with Dragon dictation along with smaller phrase technology. Any transcriptional errors that result from this process are unintentional.

## 2017-04-11 NOTE — Progress Notes (Signed)
*  PRELIMINARY RESULTS* Echocardiogram 2D Echocardiogram has been performed.  Billy Walker 04/11/2017, 3:57 PM

## 2017-04-11 NOTE — Progress Notes (Signed)
Report called to Serenity K., RN on 2A. Patient transported to room 256 on portable telemetry via hospital bed by Baker Janus, NT.

## 2017-04-12 LAB — ECHOCARDIOGRAM COMPLETE
Height: 71 in
WEIGHTICAEL: 3619.07 [oz_av]

## 2017-04-12 MED ORDER — METOPROLOL TARTRATE 25 MG PO TABS: 25.0000 mg | ORAL_TABLET | Freq: Two times a day (BID) | ORAL | 0 refills | Status: AC

## 2017-04-12 MED ORDER — ENALAPRIL MALEATE 2.5 MG PO TABS: 2.5000 mg | ORAL_TABLET | Freq: Every day | ORAL | 0 refills | Status: DC

## 2017-04-12 MED ORDER — ENALAPRIL MALEATE 5 MG PO TABS: 5.0000 mg | ORAL_TABLET | Freq: Every day | ORAL | 0 refills | Status: AC

## 2017-04-12 MED ORDER — ATORVASTATIN CALCIUM 40 MG PO TABS: 40.0000 mg | ORAL_TABLET | Freq: Every day | ORAL | 0 refills | Status: AC

## 2017-04-12 MED ORDER — TICAGRELOR 90 MG PO TABS: 90.0000 mg | ORAL_TABLET | Freq: Two times a day (BID) | ORAL | 0 refills | Status: DC

## 2017-04-12 MED ORDER — NITROGLYCERIN 0.4 MG SL SUBL: 0.4000 mg | SUBLINGUAL_TABLET | SUBLINGUAL | 0 refills | Status: AC | PRN

## 2017-04-12 NOTE — Consult Note (Signed)
Patient referred to Cardiac Rehab by Dr. Saralyn Pilar with dx of STEMI;  S/P DES proximal LAD.  EF 35 - 40%.   Patient alert and oriented x 3.  Reviewed location of blockage and where stent was placed with patient. Reviewed stent card with patient.   Patient also has 75% stenosis of Mid RCA.  Patient stated MD may place stent in this vessel in the next few weeks.  Reviewed booklet "Bouncing Back from Heart Attack" with patient.  Reviewed risk factors with patient.  (Note:  Patient is a nonsmoker.)  Reviewed medications, action, and side effects of prescribed medications with patient. Stressed the importance of taking blood thinners - antiplatelets without missing a dose.   Discussed NTG with patient - keeping it with him at all times,  when to take, calling 911, side effects, expiration dates if opened vs non-opened.  Discussed heart healthy diet with patient. Patient stated this is one area where he knows he needs to improve upon.  Dietitian consult ordered.  Dietitian to see patient prior to discharge.  Patient has follow-up appointment with Dr. Saralyn Pilar in one week.  This nurse called [facility] to see if patient's appointment was made for [facility] at hospital or the [facility] in [place].  Confirmed appointment will be on 04/19/2017 at [facility] location.  Informed patient and family. Reviewed handout,  "Vascular Procedure Discharge Instructions," with patient and family.  Questions answered.  Hard copy prescriptions will be given to patient by the assigned nurse.  Discussed Cardiac Rehab Phase II program with patient. Patient understands Dr. Saralyn Pilar has referred him to Cardiac Rehab.   Cardiac Rehab brochure given to patient.  Patient is interested in participating in this program, but wants to see what Dr. Saralyn Pilar wants to do about the other vessel with the 75% blockage first.  Will follow-up with patient after his appointment on April 19, 2017.  (See Education Documented.)  Roanna Epley, RN, BSN,  [facility] Cardiovascular and Pulmonary Nurse Navigator 0900 - 10:10

## 2017-04-12 NOTE — Plan of Care (Signed)
Problem: Food- and Nutrition-Related Knowledge Deficit (NB-1.1) Goal: Nutrition education Formal process to instruct or train a patient/client in a skill or to impart knowledge to help patients/clients voluntarily manage or modify food choices and eating behavior to maintain or improve health.  Outcome: Adequate for Discharge Nutrition Education Note  RD consulted for nutrition education regarding a Heart Healthy diet.   Lipid Panel     Component Value Date/Time   CHOL 171 04/11/2017 0700   TRIG 133 04/11/2017 0700   HDL 29 (L) 04/11/2017 0700   CHOLHDL 5.9 04/11/2017 0700   VLDL 27 04/11/2017 0700   LDLCALC 115 (H) 04/11/2017 0700    RD provided "Heart Healthy Nutrition Therapy" handout from the Academy of Nutrition and Dietetics. Reviewed patient's dietary recall. Provided examples on ways to decrease sodium and fat intake in diet. Discouraged intake of processed foods and use of salt shaker. Encouraged fresh fruits and vegetables as well as whole grain sources of carbohydrates to maximize fiber intake. Teach back method used.  Expect fair compliance.  Body mass index is 31.55 kg/m. Pt meets criteria for obese class I based on current BMI.  Current diet order is regular, patient is consuming approximately 100% of meals at this time. Labs and medications reviewed. No further nutrition interventions warranted at this time. RD contact information provided. If additional nutrition issues arise, please re-consult RD.  Satira Anis. Marvie Brevik, MS, RD LDN Inpatient Clinical Dietitian Pager [phone]

## 2017-04-12 NOTE — Discharge Summary (Signed)
[place] at [place] NAME: Billy Walker    MR#:  [id number]  DATE OF BIRTH:  Dec 12, 1945  DATE OF ADMISSION:  04/10/2017 ADMITTING PHYSICIAN: Isaias Cowman, MD  DATE OF DISCHARGE: 04/12/2017  PRIMARY CARE PHYSICIAN: Patient, No Pcp Per    ADMISSION DIAGNOSIS:  Acute ST elevation myocardial infarction (STEMI) involving left anterior descending (LAD) coronary artery (HCC) [I21.02]  DISCHARGE DIAGNOSIS:  Active Problems:   Acute ST elevation myocardial infarction (STEMI) involving left anterior descending (LAD) coronary artery (HCC)   STEMI (ST elevation myocardial infarction) ([place])   Benign hypertension   SECONDARY DIAGNOSIS:   Past Medical History:  Diagnosis Date  . Hypertension   . Prostate cancer [facility])     HOSPITAL COURSE:   71 year old male with history of essential hypertension who presented with chest pain and found to have ST elevation MI.  1. ST elevation MI: Patient was immediately sent to cardiac catheterization lab from ED. Cath shows Two-vessel coronary artery disease with acutely occluded proximal LAD and 75% stenosis mid RCA With  Mildly reduced left ventricular function with apical wall akinesis and Successful PCI with DES proximal LAD He will continue aspirin, atorvastatin, enalapril, metoprolol, nitroglycerin sublingual and brillanta. He was referred to cardiac rehabilitation at discharge  2. Essential hypertension: Continue metoprolol and enalapril  3. Hyperlipidemia: Continue statin LDL 125 with goal less than 70 4. 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 Patient will have close follow-up with cardiology. He does not appear to be in any exacerbation at this time.  DISCHARGE CONDITIONS AND DIET:    Stable for discharge on cardiac diet  CONSULTS OBTAINED:  Treatment Team:  Baxter Hire, MD Isaias Cowman, MD  DRUG  ALLERGIES:  No Known Allergies  DISCHARGE MEDICATIONS:   Current Discharge Medication List    START taking these medications   Details  atorvastatin (LIPITOR) 40 MG tablet Take 1 tablet (40 mg total) by mouth daily at 6 PM. Qty: 30 tablet, Refills: 0    enalapril (VASOTEC) 5 MG tablet Take 1 tablet (5 mg total) by mouth daily. Qty: 30 tablet, Refills: 0    metoprolol tartrate (LOPRESSOR) 25 MG tablet Take 1 tablet (25 mg total) by mouth 2 (two) times daily. Qty: 60 tablet, Refills: 0    nitroGLYCERIN (NITROSTAT) 0.4 MG SL tablet Place 1 tablet (0.4 mg total) under the tongue every 5 (five) minutes as needed for chest pain. Qty: 30 tablet, Refills: 0    ticagrelor (BRILINTA) 90 MG TABS tablet Take 1 tablet (90 mg total) by mouth 2 (two) times daily. Qty: 60 tablet, Refills: 0      CONTINUE these medications which have NOT CHANGED   Details  aspirin EC 81 MG tablet Take 81 mg by mouth daily.      STOP taking these medications     amLODipine (NORVASC) 10 MG tablet           Today   CHIEF COMPLAINT:   Doing very well this monitor report such as shortness of breath. No abnormal rhythm on telemetry.   VITAL SIGNS:  Blood pressure 126/74, pulse 74, temperature 98.6 F (37 C), temperature source Oral, resp. rate 16, height 5\' 11"  (1.803 m), weight 102.6 kg (226 lb 3.1 oz), SpO2 92 %.   REVIEW OF SYSTEMS:  Review of Systems  Constitutional: Negative.  Negative for chills, fever and malaise/fatigue.  HENT: Negative.  Negative for ear  discharge, ear pain, hearing loss, nosebleeds and sore throat.   Eyes: Negative.  Negative for blurred vision and pain.  Respiratory: Negative.  Negative for cough, hemoptysis, shortness of breath and wheezing.   Cardiovascular: Negative.  Negative for chest pain, palpitations and leg swelling.  Gastrointestinal: Negative.  Negative for abdominal pain, blood in stool, diarrhea, nausea and vomiting.  Genitourinary: Negative.  Negative for  dysuria.  Musculoskeletal: Negative.  Negative for back pain.  Skin: Negative.   Neurological: Negative for dizziness, tremors, speech change, focal weakness, seizures and headaches.  Endo/Heme/Allergies: Negative.  Does not bruise/bleed easily.  Psychiatric/Behavioral: Negative.  Negative for depression, hallucinations and suicidal ideas.     PHYSICAL EXAMINATION:  GENERAL:  71 y.o.-year-old patient lying in the bed with no acute distress.  NECK:  Supple, no jugular venous distention. No thyroid enlargement, no tenderness.  LUNGS: Normal breath sounds bilaterally, no wheezing, rales,rhonchi  No use of accessory muscles of respiration.  CARDIOVASCULAR: S1, S2 normal. No murmurs, rubs, or gallops.  ABDOMEN: Soft, non-tender, non-distended. Bowel sounds present. No organomegaly or mass.  EXTREMITIES: No pedal edema, cyanosis, or clubbing.  PSYCHIATRIC: The patient is alert and oriented x 3.  SKIN: No obvious rash, lesion, or ulcer.   DATA REVIEW:   CBC  Recent Labs Lab 04/11/17 0700  WBC 12.8*  HGB 14.2  HCT 40.0  PLT 251    Chemistries   Recent Labs Lab 04/10/17 1740 04/11/17 0101 04/11/17 0700  NA 138  --  138  K 3.3*  --  4.0  CL 111  --  111  CO2 16*  --  21*  GLUCOSE 166*  --  132*  BUN 24*  --  20  CREATININE 1.84*  --  1.59*  CALCIUM 9.2  --  8.9  MG  --  2.1  --   AST 46*  --   --   ALT 16*  --   --   ALKPHOS 101  --   --   BILITOT 0.5  --   --     Cardiac Enzymes  Recent Labs Lab 04/11/17 0101 04/11/17 0700 04/11/17 1323  TROPONINI >65.00* >65.00* >65.00*    Microbiology Results  @[email or web address] @  RADIOLOGY:  Dg Chest Port 1 View  Result Date: 04/10/2017 CLINICAL DATA:  Chest pain EXAM: PORTABLE CHEST 1 VIEW COMPARISON:  November 20, 2011 FINDINGS: There is scarring in the lung bases. Lungs elsewhere clear. Heart size and pulmonary vascularity are normal. No adenopathy. There is aortic atherosclerosis. No pneumothorax. No bone lesions.  IMPRESSION: Bibasilar scarring. No edema or consolidation. Stable cardiac silhouette. There is aortic atherosclerosis. Aortic Atherosclerosis (ICD10-I70.0). Electronically Signed   By: Lowella Grip III M.D.   On: 04/10/2017 17:18      Current Discharge Medication List    START taking these medications   Details  atorvastatin (LIPITOR) 40 MG tablet Take 1 tablet (40 mg total) by mouth daily at 6 PM. Qty: 30 tablet, Refills: 0    enalapril (VASOTEC) 5 MG tablet Take 1 tablet (5 mg total) by mouth daily. Qty: 30 tablet, Refills: 0    metoprolol tartrate (LOPRESSOR) 25 MG tablet Take 1 tablet (25 mg total) by mouth 2 (two) times daily. Qty: 60 tablet, Refills: 0    nitroGLYCERIN (NITROSTAT) 0.4 MG SL tablet Place 1 tablet (0.4 mg total) under the tongue every 5 (five) minutes as needed for chest pain. Qty: 30 tablet, Refills: 0    ticagrelor (BRILINTA) 90 MG TABS tablet Take  1 tablet (90 mg total) by mouth 2 (two) times daily. Qty: 60 tablet, Refills: 0      CONTINUE these medications which have NOT CHANGED   Details  aspirin EC 81 MG tablet Take 81 mg by mouth daily.      STOP taking these medications     amLODipine (NORVASC) 10 MG tablet          Aspirin prescribed at discharge?  Yes High Intensity Statin Prescribed? (Lipitor 40-80mg  or Crestor 20-40mg ): Yes Beta Blocker Prescribed? Yes For EF <40%, was ACEI/ARB Prescribed? Yes ADP Receptor Inhibitor Prescribed? (i.e. Plavix etc.-Includes Medically Managed Patients): Yes For EF <40%, Aldosterone Inhibitor Prescribed? No: bp to low Was EF assessed during THIS hospitalization? Yes Was Cardiac Rehab II ordered? (Included Medically managed Patients): Yes   Management plans discussed with the patient and he is in agreement. Stable for discharge   Patient should follow up with cardiology  CODE STATUS:     Code Status Orders        Start     Ordered   04/10/17 1956  Full code  Continuous     04/10/17 1956     Code Status History    Date Active Date Inactive Code Status Order ID Comments User Context   04/10/2017  7:11 PM 04/10/2017  7:56 PM Full Code [id number]  Isaias Cowman, MD Inpatient      TOTAL TIME TAKING CARE OF THIS PATIENT: 37 minutes.    Note: This dictation was prepared with Dragon dictation along with smaller phrase technology. Any transcriptional errors that result from this process are unintentional.  Magaby Rumberger M.D on 04/12/2017 at 8:03 AM  Between 7am to 6pm - Pager - [phone] After 6pm go to [email or web address] - password [email or web address] [facility]  Office  [phone]  CC: Primary care physician; Patient, No Pcp Per

## 2017-04-12 NOTE — Plan of Care (Signed)
Problem: Food- and Nutrition-Related Knowledge Deficit (NB-1.1) Goal: Nutrition education Formal process to instruct or train a patient/client in a skill or to impart knowledge to help patients/clients voluntarily manage or modify food choices and eating behavior to maintain or improve health.  Outcome: Adequate for Discharge Nutrition Education Note  RD consulted for nutrition education regarding new onset CHF.  RD provided "Low Sodium Nutrition Therapy" handout from the Academy of [employer]. Reviewed patient's dietary recall. Provided examples on ways to decrease sodium intake in diet. Discouraged intake of processed foods and use of salt shaker. Encouraged fresh fruits and vegetables as well as whole grain sources of carbohydrates to maximize fiber intake.   RD discussed why it is important for patient to adhere to diet recommendations, and emphasized the role of fluids, foods to avoid, and importance of weighing self daily. Teach back method used.  Expect fair compliance.  Body mass index is 31.55 kg/m. Pt meets criteria for obese based on current BMI.  Current diet order is regular, patient is consuming approximately 100% of meals at this time. Labs and medications reviewed. No further nutrition interventions warranted at this time. RD contact information provided. If additional nutrition issues arise, please re-consult RD.   Billy Walker. Billy Rivere, MS, RD LDN Inpatient Clinical Dietitian Pager [phone]

## 2017-04-12 NOTE — Progress Notes (Signed)
Discharge instructions was given to the patient and family with hand out. Patient and family verbalized understanding and teach-back. Discontinue PIV and telemetry monitoring. Patient's family is going to transport patient to home. No other concern at the moment. RN will continue to monitor.

## 2017-04-12 NOTE — Progress Notes (Signed)
[facility]  SUBJECTIVE: The patient was doing well, denies chest pain or shortness of breath   Vitals:   04/11/17 1000 04/11/17 1147 04/11/17 2025 04/12/17 0455  BP: 110/71 114/71 111/66 126/74  Pulse: 66 64 72 74  Resp: (!) 21 16 18 16   Temp:  98.4 F (36.9 C) 99.1 F (37.3 C) 98.6 F (37 C)  TempSrc:  Oral Oral Oral  SpO2: 94% 92% 94% 92%  Weight:      Height:         Intake/Output Summary (Last 24 hours) at 04/12/17 0804 Last data filed at 04/12/17 8413  Gross per 24 hour  Intake            298.2 ml  Output             1300 ml  Net          -1001.8 ml      PHYSICAL EXAM  General: Well developed, well nourished, in no acute distress HEENT:  Normocephalic and atramatic Neck:  No JVD.  Lungs: Clear bilaterally to auscultation and percussion. Heart: HRRR . Normal S1 and S2 without gallops or murmurs.  Abdomen: Bowel sounds are positive, abdomen soft and non-tender  Msk:  Back normal, normal gait. Normal strength and tone for age. Extremities: No clubbing, cyanosis or edema.   Neuro: Alert and oriented X 3. Psych:  Good affect, responds appropriately   LABS: Basic Metabolic Panel:  Recent Labs  04/10/17 1740 04/11/17 0101 04/11/17 0700  NA 138  --  138  K 3.3*  --  4.0  CL 111  --  111  CO2 16*  --  21*  GLUCOSE 166*  --  132*  BUN 24*  --  20  CREATININE 1.84*  --  1.59*  CALCIUM 9.2  --  8.9  MG  --  2.1  --   PHOS  --  3.6  --    Liver Function Tests:  Recent Labs  04/10/17 1700 04/10/17 1740  AST 52* 46*  ALT 17 16*  ALKPHOS 108 101  BILITOT 0.9 0.5  PROT 8.0 7.4  ALBUMIN 4.2 3.8   No results for input(s): LIPASE, AMYLASE in the last 72 hours. CBC:  Recent Labs  04/10/17 1700 04/11/17 0700  WBC 12.8* 12.8*  NEUTROABS 7.5*  --   HGB 14.9 14.2  HCT 42.9 40.0  MCV 82.7 83.3  PLT 286 251   Cardiac Enzymes:  Recent Labs  04/11/17 0101 04/11/17 0700 04/11/17 1323  TROPONINI >65.00* >65.00* >65.00*   BNP: Invalid  input(s): POCBNP D-Dimer: No results for input(s): DDIMER in the last 72 hours. Hemoglobin A1C: No results for input(s): HGBA1C in the last 72 hours. Fasting Lipid Panel:  Recent Labs  04/11/17 0700  CHOL 171  HDL 29*  LDLCALC 115*  TRIG 133  CHOLHDL 5.9   Thyroid Function Tests: No results for input(s): TSH, T4TOTAL, T3FREE, THYROIDAB in the last 72 hours.  Invalid input(s): FREET3 Anemia Panel: No results for input(s): VITAMINB12, FOLATE, FERRITIN, TIBC, IRON, RETICCTPCT in the last 72 hours.  Dg Chest Port 1 View  Result Date: 04/10/2017 CLINICAL DATA:  Chest pain EXAM: PORTABLE CHEST 1 VIEW COMPARISON:  November 20, 2011 FINDINGS: There is scarring in the lung bases. Lungs elsewhere clear. Heart size and pulmonary vascularity are normal. No adenopathy. There is aortic atherosclerosis. No pneumothorax. No bone lesions. IMPRESSION: Bibasilar scarring. No edema or consolidation. Stable cardiac silhouette. There is aortic atherosclerosis. Aortic Atherosclerosis (ICD10-I70.0).  Electronically Signed   By: Lowella Grip III M.D.   On: 04/10/2017 17:18     Echo LV EF 35-40% with apical, anteroseptal, and anterolateral wall hypokinesis  TELEMETRY: Normal sinus rhythm:  ASSESSMENT AND PLAN:  Active Problems:   Acute ST elevation myocardial infarction (STEMI) involving left anterior descending (LAD) coronary artery (HCC)   STEMI (ST elevation myocardial infarction) (HCC)   Benign hypertension    1. Anterior STEMI, status post primary PCI with DES proximal LAD, residual 75% stenosis in mid LAD, currently without chest pain, ambulating without difficulty 2. Ischemic cardiomyopathy, LVEF 35-40% without evidence for congestive heart failure  Recommendations  1. Agree with overall current therapy 2. Uptitrate enalapril 5 mg daily 3. Continue dual antiplatelet therapy uninterrupted for 1 year 4. Ambulate patient, if patient does well may discharge home, follow-up in one  week   Isaias Cowman, MD, PhD, [facility] 04/12/2017 8:04 AM

## 2017-04-12 NOTE — Care Management Important Message (Signed)
Important Message  Patient Details  Name: MARLEY CHARLOT MRN: [id number] Date of Birth: 1946/06/03   Medicare Important Message Given:  N/A - LOS <3 / Initial given by admissions    Katrina Stack, RN 04/12/2017, 9:20 AM

## 2017-04-12 NOTE — Plan of Care (Signed)
Problem: Activity: Goal: Ability to return to baseline activity level will improve Outcome: Progressing Patient ambulated around nurses station twice, tolerated well. No complaints of pain, no bleeding at cath site. Patient to d/c home.

## 2017-05-03 ENCOUNTER — Ambulatory Visit
Admission: RE | Admit: 2017-05-03 | Discharge: 2017-05-04 | Disposition: A | Discharge status: Home / Self Care | Payer: Medicare Other | Source: Ambulatory Visit | Attending: Cardiology | Admitting: Cardiology

## 2017-05-03 ENCOUNTER — Encounter
Admission: RE | Disposition: A | Discharge status: Home / Self Care | Payer: Self-pay | Source: Ambulatory Visit | Attending: Cardiology

## 2017-05-03 ENCOUNTER — Encounter: Payer: Self-pay | Admitting: *Deleted

## 2017-05-03 DIAGNOSIS — I2510 Atherosclerotic heart disease of native coronary artery without angina pectoris: Secondary | ICD-10-CM

## 2017-05-03 DIAGNOSIS — I252 Old myocardial infarction: Secondary | ICD-10-CM | POA: Diagnosis not present

## 2017-05-03 DIAGNOSIS — Z9889 Other specified postprocedural states: Secondary | ICD-10-CM

## 2017-05-03 DIAGNOSIS — Z7902 Long term (current) use of antithrombotics/antiplatelets: Secondary | ICD-10-CM | POA: Diagnosis not present

## 2017-05-03 DIAGNOSIS — N289 Disorder of kidney and ureter, unspecified: Secondary | ICD-10-CM | POA: Insufficient documentation

## 2017-05-03 DIAGNOSIS — I10 Essential (primary) hypertension: Secondary | ICD-10-CM | POA: Diagnosis not present

## 2017-05-03 DIAGNOSIS — Z955 Presence of coronary angioplasty implant and graft: Secondary | ICD-10-CM | POA: Diagnosis not present

## 2017-05-03 DIAGNOSIS — Z7982 Long term (current) use of aspirin: Secondary | ICD-10-CM | POA: Insufficient documentation

## 2017-05-03 DIAGNOSIS — Z9861 Coronary angioplasty status: Secondary | ICD-10-CM

## 2017-05-03 HISTORY — PX: CORONARY STENT INTERVENTION: CATH118234

## 2017-05-03 LAB — POCT ACTIVATED CLOTTING TIME: Activated Clotting Time: 412 seconds

## 2017-05-03 SURGERY — CORONARY STENT INTERVENTION
Anesthesia: Moderate Sedation

## 2017-05-03 MED ORDER — BIVALIRUDIN BOLUS VIA INFUSION - CUPID
INTRAVENOUS | Status: DC | PRN
  Administered 2017-05-03: 74.85 mg via INTRAVENOUS

## 2017-05-03 MED ORDER — SODIUM CHLORIDE 0.9 % IV SOLN: 250.0000 mL | INTRAVENOUS | Status: DC | PRN

## 2017-05-03 MED ORDER — SODIUM CHLORIDE 0.9 % WEIGHT BASED INFUSION: 3.0000 mL/kg/h | INTRAVENOUS | Status: AC

## 2017-05-03 MED ORDER — NITROGLYCERIN 5 MG/ML IV SOLN
INTRAVENOUS | Status: AC
  Filled 2017-05-03: qty 10

## 2017-05-03 MED ORDER — SODIUM CHLORIDE 0.9% FLUSH: 3.0000 mL | INTRAVENOUS | Status: DC | PRN

## 2017-05-03 MED ORDER — CLOPIDOGREL BISULFATE 75 MG PO TABS
75.0000 mg | ORAL_TABLET | Freq: Every day | ORAL | Status: DC
  Administered 2017-05-03: 75 mg via ORAL
  Filled 2017-05-03 (×2): qty 1

## 2017-05-03 MED ORDER — ATORVASTATIN CALCIUM 20 MG PO TABS
40.0000 mg | ORAL_TABLET | Freq: Every day | ORAL | Status: DC
  Administered 2017-05-03: 40 mg via ORAL
  Filled 2017-05-03: qty 2

## 2017-05-03 MED ORDER — NITROGLYCERIN 1 MG/10 ML FOR IR/CATH LAB
INTRA_ARTERIAL | Status: DC | PRN
  Administered 2017-05-03: 200 ug via INTRACORONARY

## 2017-05-03 MED ORDER — MIDAZOLAM HCL 2 MG/2ML IJ SOLN
INTRAMUSCULAR | Status: AC
  Filled 2017-05-03: qty 2

## 2017-05-03 MED ORDER — LIDOCAINE HCL (PF) 1 % IJ SOLN
INTRAMUSCULAR | Status: AC
  Filled 2017-05-03: qty 30

## 2017-05-03 MED ORDER — SODIUM CHLORIDE 0.9% FLUSH: 3.0000 mL | Freq: Two times a day (BID) | INTRAVENOUS | Status: DC

## 2017-05-03 MED ORDER — SODIUM CHLORIDE 0.9 % WEIGHT BASED INFUSION
1.0000 mL/kg/h | INTRAVENOUS | Status: DC
  Administered 2017-05-03: 1 mL/kg/h via INTRAVENOUS

## 2017-05-03 MED ORDER — FENTANYL CITRATE (PF) 100 MCG/2ML IJ SOLN
INTRAMUSCULAR | Status: DC | PRN
  Administered 2017-05-03: 25 ug via INTRAVENOUS

## 2017-05-03 MED ORDER — IOPAMIDOL (ISOVUE-300) INJECTION 61%
INTRAVENOUS | Status: DC | PRN
  Administered 2017-05-03: 170 mL via INTRA_ARTERIAL

## 2017-05-03 MED ORDER — HEPARIN (PORCINE) IN NACL 2-0.9 UNIT/ML-% IJ SOLN
INTRAMUSCULAR | Status: AC
  Filled 2017-05-03: qty 500

## 2017-05-03 MED ORDER — CLOPIDOGREL BISULFATE 75 MG PO TABS
ORAL_TABLET | ORAL | Status: DC | PRN
  Administered 2017-05-03: 300 mg via ORAL

## 2017-05-03 MED ORDER — ENALAPRIL MALEATE 5 MG PO TABS
5.0000 mg | ORAL_TABLET | Freq: Every day | ORAL | Status: DC
  Administered 2017-05-04: 5 mg via ORAL
  Filled 2017-05-03 (×2): qty 1

## 2017-05-03 MED ORDER — ASPIRIN 81 MG PO CHEW
CHEWABLE_TABLET | ORAL | Status: DC | PRN
  Administered 2017-05-03: 243 mg via ORAL

## 2017-05-03 MED ORDER — ASPIRIN 81 MG PO CHEW: 81.0000 mg | CHEWABLE_TABLET | ORAL | Status: DC

## 2017-05-03 MED ORDER — MIDAZOLAM HCL 2 MG/2ML IJ SOLN
INTRAMUSCULAR | Status: DC | PRN
  Administered 2017-05-03: 1 mg via INTRAVENOUS

## 2017-05-03 MED ORDER — NITROGLYCERIN 0.4 MG SL SUBL: 0.4000 mg | SUBLINGUAL_TABLET | SUBLINGUAL | Status: DC | PRN

## 2017-05-03 MED ORDER — CLOPIDOGREL BISULFATE 75 MG PO TABS
ORAL_TABLET | ORAL | Status: AC
  Filled 2017-05-03: qty 4

## 2017-05-03 MED ORDER — ASPIRIN 81 MG PO CHEW
CHEWABLE_TABLET | ORAL | Status: AC
  Filled 2017-05-03: qty 3

## 2017-05-03 MED ORDER — FENTANYL CITRATE (PF) 100 MCG/2ML IJ SOLN
INTRAMUSCULAR | Status: AC
Start: 2017-05-03 — End: ?
  Filled 2017-05-03: qty 2

## 2017-05-03 MED ORDER — SODIUM CHLORIDE 0.9 % IV SOLN
INTRAVENOUS | Status: DC | PRN
  Administered 2017-05-03: 1.75 mg/kg/h via INTRAVENOUS

## 2017-05-03 MED ORDER — BIVALIRUDIN TRIFLUOROACETATE 250 MG IV SOLR
INTRAVENOUS | Status: AC
  Filled 2017-05-03: qty 250

## 2017-05-03 MED ORDER — METOPROLOL TARTRATE 25 MG PO TABS
25.0000 mg | ORAL_TABLET | Freq: Two times a day (BID) | ORAL | Status: DC
  Administered 2017-05-04: 25 mg via ORAL
  Filled 2017-05-03 (×2): qty 1

## 2017-05-03 MED ORDER — ASPIRIN EC 81 MG PO TBEC
81.0000 mg | DELAYED_RELEASE_TABLET | Freq: Every day | ORAL | Status: DC
  Administered 2017-05-04: 81 mg via ORAL
  Filled 2017-05-03: qty 1

## 2017-05-03 SURGICAL SUPPLY — 13 items
BALLN ~~LOC~~ TREK RX 3.0X15 (BALLOONS) ×3
BALLOON ~~LOC~~ TREK RX 3.0X15 (BALLOONS) ×1 IMPLANT
CATH INFINITI 5FR JL4 (CATHETERS) ×3 IMPLANT
CATH VISTA GUIDE 6FR JR4 SH (CATHETERS) ×3 IMPLANT
DEVICE CLOSURE MYNXGRIP 6/7F (Vascular Products) ×3 IMPLANT
DEVICE INFLAT 30 PLUS (MISCELLANEOUS) ×3 IMPLANT
GUIDEWIRE 3MM J TIP .035 145 (WIRE) ×3 IMPLANT
KIT MANI 3VAL PERCEP (MISCELLANEOUS) ×3 IMPLANT
NEEDLE PERC 18GX7CM (NEEDLE) ×3 IMPLANT
PACK CARDIAC CATH (CUSTOM PROCEDURE TRAY) ×3 IMPLANT
SHEATH AVANTI 6FR X 11CM (SHEATH) ×3 IMPLANT
STENT SIERRA 2.50 X 15 MM (Permanent Stent) ×3 IMPLANT
WIRE ASAHI PROWATER 180CM (WIRE) ×3 IMPLANT

## 2017-05-03 NOTE — Progress Notes (Signed)
Femoral site care and moderate sedation teaching performed with patient and family at bedside. Stent card and mynx closure packet teaching done and packet given to patient's wife. Resting quietly at this time, groin site WNL, pulses at pre-procedure baseline. Will continue to monitor.

## 2017-05-03 NOTE — Progress Notes (Signed)
Rounded on patient.  Patient S/P Cath with DES to mid RCA today.  Patient was here approximately 3 weeks ago with dx of STEMI requiring DES to proximal LAD.  EF at that time 35 - 40%.  Patient alert and oriented, lying in bed with HOB elevated.  Wife at bedside.  Patient stated this procedure was a lot easier than the one three weeks ago. Patient reported he had no problems and no chest pain from his last procedure when he went home.  Patient nor wife had any questions at this time.  Order per protocol entered for Cardiac Rehab.     Roanna Epley, RN, BSN, [facility] Cardiovascular and Pulmonary Nurse Navigator

## 2017-05-04 DIAGNOSIS — I2510 Atherosclerotic heart disease of native coronary artery without angina pectoris: Secondary | ICD-10-CM | POA: Diagnosis not present

## 2017-05-04 NOTE — Discharge Summary (Signed)
   Physician Discharge Summary  Patient ID: Billy Walker MRN: [id number] DOB/AGE: May 01, 1946 71 y.o.  Admit date: 05/03/2017 Discharge date: 05/04/2017  Primary Discharge Diagnosis Chest pain Secondary Discharge Diagnosis coronary artery disease  Significant Diagnostic Studies: yes  Consults: None  Hospital Course: The patient underwent elective PCI on 05/03/2017. The patient is status post anterior STEMI and primary PCI 04/10/2017. The patient had no residual 75% stenosis mid RCA. Today, the patient underwent staged procedure, with direct stenting, with 2.5 x 15 mm Xience Sierra drug-eluting stent mid RCA with an excellent angiographic result. The patient was returned to telemetry and had an uncomplicated hospital course. On the morning and 05/04/2017 the patient was ambulated without difficulty was discharged home.   Discharge Exam: Blood pressure 121/69, pulse 68, temperature 98.4 F (36.9 C), temperature source Oral, resp. rate 17, height 5\' 11"  (1.803 m), weight 99.8 kg (220 lb), SpO2 96 %.   General appearance: alert Head: Normocephalic, without obvious abnormality, atraumatic Eyes: conjunctivae/corneas clear. PERRL, EOM's intact. Fundi benign. Ears: normal TM's and external ear canals both ears Nose: Nares normal. Septum midline. Mucosa normal. No drainage or sinus tenderness. Throat: lips, mucosa, and tongue normal; teeth and gums normal Neck: no adenopathy, no carotid bruit, no JVD, supple, symmetrical, trachea midline and thyroid not enlarged, symmetric, no tenderness/mass/nodules Back: symmetric, no curvature. ROM normal. No CVA tenderness. Resp: clear to auscultation bilaterally Chest wall: no tenderness Cardio: regular rate and rhythm, S1, S2 normal, no murmur, click, rub or gallop GI: soft, non-tender; bowel sounds normal; no masses,  no organomegaly Pelvic: cervix normal in appearance, external genitalia normal, no adnexal masses or tenderness, no cervical motion  tenderness, rectovaginal septum normal, uterus normal size, shape, and consistency and vagina normal without discharge Extremities: extremities normal, atraumatic, no cyanosis or edema Pulses: 2+ and symmetric Skin: Skin color, texture, turgor normal. No rashes or lesions Lymph nodes: Cervical, supraclavicular, and axillary nodes normal. Neurologic: Alert and oriented X 3, normal strength and tone. Normal symmetric reflexes. Normal coordination and gait Labs:   Lab Results  Component Value Date   WBC 12.8 (H) 04/11/2017   HGB 14.2 04/11/2017   HCT 40.0 04/11/2017   MCV 83.3 04/11/2017   PLT 251 04/11/2017   No results for input(s): NA, K, CL, CO2, BUN, CREATININE, CALCIUM, PROT, BILITOT, ALKPHOS, ALT, AST, GLUCOSE in the last 168 hours.  Invalid input(s): LABALBU    Radiology:  EKG: Normal sinus rhythm  FOLLOW UP PLANS AND APPOINTMENTS Discharge Instructions    Amb Referral to Cardiac Rehabilitation    Complete by:  As directed    Diagnosis:  Coronary Stents        BRING ALL MEDICATIONS WITH YOU TO FOLLOW UP APPOINTMENTS  Time spent with patient to include physician time: 25 minutes Signed:  Isaias Cowman MD, PhD, [facility] 05/04/2017, 7:46 AM

## 2017-05-04 NOTE — Progress Notes (Signed)
Pt instructed on discharge instructions and medications and verbalized understanding, IV catheters discontinued, tips intact, telemetry monitor removed, pt left ambulating in stable condition with no s/s of distress or discomfort.  Jonavon Trieu, RN  

## 2017-05-11 DIAGNOSIS — Z955 Presence of coronary angioplasty implant and graft: Secondary | ICD-10-CM | POA: Insufficient documentation

## 2017-05-23 ENCOUNTER — Encounter: Payer: Medicare Other | Attending: Cardiology | Admitting: *Deleted

## 2017-05-23 ENCOUNTER — Encounter: Payer: Self-pay | Admitting: *Deleted

## 2017-05-23 VITALS — Ht 69.5 in | Wt 222.0 lb

## 2017-05-23 DIAGNOSIS — Z7982 Long term (current) use of aspirin: Secondary | ICD-10-CM | POA: Insufficient documentation

## 2017-05-23 DIAGNOSIS — Z8546 Personal history of malignant neoplasm of prostate: Secondary | ICD-10-CM | POA: Insufficient documentation

## 2017-05-23 DIAGNOSIS — I10 Essential (primary) hypertension: Secondary | ICD-10-CM | POA: Diagnosis not present

## 2017-05-23 DIAGNOSIS — Z955 Presence of coronary angioplasty implant and graft: Secondary | ICD-10-CM | POA: Diagnosis present

## 2017-05-23 DIAGNOSIS — Z79899 Other long term (current) drug therapy: Secondary | ICD-10-CM | POA: Diagnosis not present

## 2017-05-23 DIAGNOSIS — Z7902 Long term (current) use of antithrombotics/antiplatelets: Secondary | ICD-10-CM | POA: Insufficient documentation

## 2017-05-23 NOTE — Patient Instructions (Signed)
Patient Instructions  Patient Details  Name: Billy Walker MRN: [id number] Date of Birth: October 13, 1945 Referring Provider:  Isaias Cowman, MD  Below are the personal goals you chose as well as exercise and nutrition goals. Our goal is to help you keep on track towards obtaining and maintaining your goals. We will be discussing your progress on these goals with you throughout the program.  Initial Exercise Prescription:     Initial Exercise Prescription - 05/23/17 1500      Date of Initial Exercise RX and Referring Provider   Date 05/23/17   Referring Provider Isaias Cowman MD     Treadmill   MPH 2.4   Grade 0.5   Minutes 15   METs 3     Bike   Level 1.1   Watts 32   Minutes 15   METs 2.9     NuStep   Level 3   SPM 80   Minutes 15   METs 3     Prescription Details   Frequency (times per week) 3   Duration Progress to 45 minutes of aerobic exercise without signs/symptoms of physical distress     Intensity   THRR 40-80% of Max Heartrate 93-130   Ratings of Perceived Exertion 11-13   Perceived Dyspnea 0-4     Progression   Progression Continue to progress workloads to maintain intensity without signs/symptoms of physical distress.     Resistance Training   Training Prescription Yes   Weight 4 lbs   Reps 10-15      Exercise Goals: Frequency: Be able to perform aerobic exercise three times per week working toward 3-5 days per week.  Intensity: Work with a perceived exertion of 11 (fairly light) - 15 (hard) as tolerated. Follow your new exercise prescription and watch for changes in prescription as you progress with the program. Changes will be reviewed with you when they are made.  Duration: You should be able to do 30 minutes of continuous aerobic exercise in addition to a 5 minute warm-up and a 5 minute cool-down routine.  Nutrition Goals: Your personal nutrition goals will be established when you do your nutrition analysis with the  dietician.  The following are nutrition guidelines to follow: Cholesterol < 200mg /day Sodium < 1500mg /day Fiber: Men over 50 yrs - 30 grams per day  Personal Goals:     Personal Goals and Risk Factors at Admission - 05/23/17 1229      Core Components/Risk Factors/Patient Goals on Admission    Weight Management Yes;Obesity   Intervention Weight Management: Develop a combined nutrition and exercise program designed to reach desired caloric intake, while maintaining appropriate intake of nutrient and fiber, sodium and fats, and appropriate energy expenditure required for the weight goal.;Weight Management: Provide education and appropriate resources to help participant work on and attain dietary goals.;Weight Management/Obesity: Establish reasonable short term and long term weight goals.;Obesity: Provide education and appropriate resources to help participant work on and attain dietary goals.   Admit Weight 222 lb (100.7 kg)   Goal Weight: Short Term 217 lb (98.4 kg)   Goal Weight: Long Term 180 lb (81.6 kg)   Expected Outcomes Short Term: Continue to assess and modify interventions until short term weight is achieved;Long Term: Adherence to nutrition and physical activity/exercise program aimed toward attainment of established weight goal;Weight Loss: Understanding of general recommendations for a balanced deficit meal plan, which promotes 1-2 lb weight loss per week and includes a negative energy balance of [phone] kcal/d;Understanding recommendations for  meals to include 15-35% energy as protein, 25-35% energy from fat, 35-60% energy from carbohydrates, less than 200mg  of dietary cholesterol, 20-35 gm of total fiber daily;Understanding of distribution of calorie intake throughout the day with the consumption of 4-5 meals/snacks   Hypertension Yes   Intervention Provide education on lifestyle modifcations including regular physical activity/exercise, weight management, moderate sodium restriction  and increased consumption of fresh fruit, vegetables, and low fat dairy, alcohol moderation, and smoking cessation.;Monitor prescription use compliance.   Expected Outcomes Short Term: Continued assessment and intervention until BP is < 140/80mm HG in hypertensive participants. < 130/63mm HG in hypertensive participants with diabetes, heart failure or chronic kidney disease.;Long Term: Maintenance of blood pressure at goal levels.   Lipids Yes   Intervention Provide education and support for participant on nutrition & aerobic/resistive exercise along with prescribed medications to achieve LDL 70mg , HDL >40mg .   Expected Outcomes Short Term: Participant states understanding of desired cholesterol values and is compliant with medications prescribed. Participant is following exercise prescription and nutrition guidelines.;Long Term: Cholesterol controlled with medications as prescribed, with individualized exercise RX and with personalized nutrition plan. Value goals: LDL < 70mg , HDL > 40 mg.   Personal Goal Other Yes   Personal Goal Billy Walker would like to learn more about cardiac education because this event came as a surprise.    Intervention During [facility], education will be provided about heart disease and other components of the program   Expected Outcomes Short Term: Billy Walker will attent [facility]; Long Term: Billy Walker will apply lifestyle changes that he has learned in [facility] and become more aware of heart disease      Tobacco Use Initial Evaluation: History  Smoking Status  . Never Smoker  Smokeless Tobacco  . Never Used    Exercise Goals and Review:     Exercise Goals    Row Name 05/23/17 1550             Exercise Goals   Increase Physical Activity Yes       Intervention Provide advice, education, support and counseling about physical activity/exercise needs.;Develop an individualized exercise prescription for aerobic and resistive training based on initial evaluation  findings, risk stratification, comorbidities and participant's personal goals.       Expected Outcomes Achievement of increased cardiorespiratory fitness and enhanced flexibility, muscular endurance and strength shown through measurements of functional capacity and personal statement of participant.       Increase Strength and Stamina Yes       Intervention Provide advice, education, support and counseling about physical activity/exercise needs.;Develop an individualized exercise prescription for aerobic and resistive training based on initial evaluation findings, risk stratification, comorbidities and participant's personal goals.       Expected Outcomes Achievement of increased cardiorespiratory fitness and enhanced flexibility, muscular endurance and strength shown through measurements of functional capacity and personal statement of participant.       Able to understand and use rate of perceived exertion (RPE) scale Yes       Intervention Provide education and explanation on how to use RPE scale       Expected Outcomes Short Term: Able to use RPE daily in rehab to express subjective intensity level;Long Term:  Able to use RPE to guide intensity level when exercising independently       Knowledge and understanding of Target Heart Rate Range (THRR) Yes       Intervention Provide education and explanation of THRR including how the numbers were predicted and where they  are located for reference       Expected Outcomes Short Term: Able to state/look up THRR;Long Term: Able to use THRR to govern intensity when exercising independently;Short Term: Able to use daily as guideline for intensity in rehab       Able to check pulse independently Yes       Intervention Provide education and demonstration on how to check pulse in carotid and radial arteries.;Review the importance of being able to check your own pulse for safety during independent exercise       Expected Outcomes Short Term: Able to explain why  pulse checking is important during independent exercise;Long Term: Able to check pulse independently and accurately       Understanding of Exercise Prescription Yes       Intervention Provide education, explanation, and written materials on patient's individual exercise prescription       Expected Outcomes Short Term: Able to explain program exercise prescription;Long Term: Able to explain home exercise prescription to exercise independently          Copy of goals given to participant.

## 2017-05-23 NOTE — Progress Notes (Signed)
Daily Session Note  Patient Details  Name: Billy Walker MRN: [id number] Date of Birth: [birth year] Referring Provider:     Cardiac Rehab from 05/23/2017 in [facility] Cardiac and Pulmonary Rehab  Referring Provider  Isaias Cowman MD      Encounter Date: 05/23/2017  Check In:     Session Check In - 05/23/17 1222      Check-In   Location [facility]-Cardiac & Pulmonary Rehab   Staff Present Alberteen Sam, MA, ACSM RCEP, Exercise Physiologist;Meredith Sherryll Burger, RN BSN   Supervising physician immediately available to respond to emergencies See telemetry face sheet for immediately available ER MD   Medication changes reported     No   Fall or balance concerns reported    No   Warm-up and Cool-down Performed as group-led instruction   Resistance Training Performed Yes   VAD Patient? No     Pain Assessment   Currently in Pain? No/denies           Exercise Prescription Changes - 05/23/17 1300      Response to Exercise   Blood Pressure (Admit) 122/66   Blood Pressure (Exercise) 164/74   Blood Pressure (Exit) 120/66   Heart Rate (Admit) 55 bpm   Heart Rate (Exercise) 80 bpm   Heart Rate (Exit) 66 bpm   Oxygen Saturation (Admit) 100 %   Oxygen Saturation (Exercise) 100 %   Rating of Perceived Exertion (Exercise) 12   Symptoms none   Comments walk test results      History  Smoking Status  . Never Smoker  Smokeless Tobacco  . Never Used    Goals Met:  Exercise tolerated well Personal goals reviewed No report of cardiac concerns or symptoms Strength training completed today  Goals Unmet:  Not Applicable  Comments: Intial ITP created   Dr. Emily Filbert is Medical Director for [place] and [facility].

## 2017-05-23 NOTE — Progress Notes (Signed)
Cardiac Individual Treatment Plan  Patient Details  Name: Billy Walker MRN: [id number] Date of Birth: 05-20-1946 Referring Provider:     Cardiac Rehab from 05/23/2017 in [facility] Cardiac and Pulmonary Rehab  Referring Provider  Isaias Cowman MD      Initial Encounter Date:    Cardiac Rehab from 05/23/2017 in [facility] Cardiac and Pulmonary Rehab  Date  05/23/17  Referring Provider  Isaias Cowman MD      Visit Diagnosis: Status post coronary artery stent placement  Patient's Home Medications on Admission:  Current Outpatient Prescriptions:  .  aspirin EC 81 MG tablet, Take 81 mg by mouth daily., Disp: , Rfl:  .  atorvastatin (LIPITOR) 40 MG tablet, Take 1 tablet (40 mg total) by mouth daily at 6 PM., Disp: 30 tablet, Rfl: 0 .  clopidogrel (PLAVIX) 75 MG tablet, Take 75 mg by mouth at bedtime., Disp: , Rfl:  .  enalapril (VASOTEC) 5 MG tablet, Take 1 tablet (5 mg total) by mouth daily., Disp: 30 tablet, Rfl: 0 .  metoprolol tartrate (LOPRESSOR) 25 MG tablet, Take 1 tablet (25 mg total) by mouth 2 (two) times daily., Disp: 60 tablet, Rfl: 0 .  nitroGLYCERIN (NITROSTAT) 0.4 MG SL tablet, Place 1 tablet (0.4 mg total) under the tongue every 5 (five) minutes as needed for chest pain., Disp: 30 tablet, Rfl: 0  Past Medical History: Past Medical History:  Diagnosis Date  . Hypertension   . Prostate cancer ([place])     Tobacco Use: History  Smoking Status  . Never Smoker  Smokeless Tobacco  . Never Used    Labs: Recent Review Flowsheet Data    Labs for ITP Cardiac and Pulmonary Rehab Latest Ref Rng & Units 04/10/2017 04/11/2017   Cholestrol 0 - 200 mg/dL 195 171   LDLCALC 0 - 99 mg/dL 125(H) 115(H)   HDL >40 mg/dL 34(L) 29(L)   Trlycerides <150 mg/dL 182(H) 133       Exercise Target Goals: Date: 05/23/17  Exercise Program Goal: Individual exercise prescription set with THRR, safety & activity barriers. Participant demonstrates ability to understand and report RPE  using BORG scale, to self-measure pulse accurately, and to acknowledge the importance of the exercise prescription.  Exercise Prescription Goal: Starting with aerobic activity 30 plus minutes a day, 3 days per week for initial exercise prescription. Provide home exercise prescription and guidelines that participant acknowledges understanding prior to discharge.  Activity Barriers & Risk Stratification:     Activity Barriers & Cardiac Risk Stratification - 05/23/17 1239      Activity Barriers & Cardiac Risk Stratification   Activity Barriers Joint Problems;Deconditioning;Muscular Weakness  occasional left knee pain   Cardiac Risk Stratification Moderate      6 Minute Walk:     6 Minute Walk    Row Name 05/23/17 1546         6 Minute Walk   Phase Initial     Distance 1550 feet     Walk Time 6 minutes     # of Rest Breaks 0     MPH 2.94     METS 2.99     RPE 12     Perceived Dyspnea  12     VO2 Peak 10.49     Symptoms No     Resting HR 55 bpm     Resting BP 122/66     Resting Oxygen Saturation  100 %     Exercise Oxygen Saturation  during 6 min walk 100 %  Max Ex. HR 78 bpm     Max Ex. BP 164/74     2 Minute Post BP 120/66        Oxygen Initial Assessment:   Oxygen Re-Evaluation:   Oxygen Discharge (Final Oxygen Re-Evaluation):   Initial Exercise Prescription:     Initial Exercise Prescription - 05/23/17 1500      Date of Initial Exercise RX and Referring Provider   Date 05/23/17   Referring Provider Isaias Cowman MD     Treadmill   MPH 2.4   Grade 0.5   Minutes 15   METs 3     Bike   Level 1.1   Watts 32   Minutes 15   METs 2.9     NuStep   Level 3   SPM 80   Minutes 15   METs 3     Prescription Details   Frequency (times per week) 3   Duration Progress to 45 minutes of aerobic exercise without signs/symptoms of physical distress     Intensity   THRR 40-80% of Max Heartrate 93-130   Ratings of Perceived Exertion 11-13    Perceived Dyspnea 0-4     Progression   Progression Continue to progress workloads to maintain intensity without signs/symptoms of physical distress.     Resistance Training   Training Prescription Yes   Weight 4 lbs   Reps 10-15      Perform Capillary Blood Glucose checks as needed.  Exercise Prescription Changes:     Exercise Prescription Changes    Row Name 05/23/17 1300             Response to Exercise   Blood Pressure (Admit) 122/66       Blood Pressure (Exercise) 164/74       Blood Pressure (Exit) 120/66       Heart Rate (Admit) 55 bpm       Heart Rate (Exercise) 80 bpm       Heart Rate (Exit) 66 bpm       Oxygen Saturation (Admit) 100 %       Oxygen Saturation (Exercise) 100 %       Rating of Perceived Exertion (Exercise) 12       Symptoms none       Comments walk test results          Exercise Comments:   Exercise Goals and Review:     Exercise Goals    Row Name 05/23/17 1550             Exercise Goals   Increase Physical Activity Yes       Intervention Provide advice, education, support and counseling about physical activity/exercise needs.;Develop an individualized exercise prescription for aerobic and resistive training based on initial evaluation findings, risk stratification, comorbidities and participant's personal goals.       Expected Outcomes Achievement of increased cardiorespiratory fitness and enhanced flexibility, muscular endurance and strength shown through measurements of functional capacity and personal statement of participant.       Increase Strength and Stamina Yes       Intervention Provide advice, education, support and counseling about physical activity/exercise needs.;Develop an individualized exercise prescription for aerobic and resistive training based on initial evaluation findings, risk stratification, comorbidities and participant's personal goals.       Expected Outcomes Achievement of increased cardiorespiratory fitness  and enhanced flexibility, muscular endurance and strength shown through measurements of functional capacity and personal statement of participant.  Able to understand and use rate of perceived exertion (RPE) scale Yes       Intervention Provide education and explanation on how to use RPE scale       Expected Outcomes Short Term: Able to use RPE daily in rehab to express subjective intensity level;Long Term:  Able to use RPE to guide intensity level when exercising independently       Knowledge and understanding of Target Heart Rate Range (THRR) Yes       Intervention Provide education and explanation of THRR including how the numbers were predicted and where they are located for reference       Expected Outcomes Short Term: Able to state/look up THRR;Long Term: Able to use THRR to govern intensity when exercising independently;Short Term: Able to use daily as guideline for intensity in rehab       Able to check pulse independently Yes       Intervention Provide education and demonstration on how to check pulse in carotid and radial arteries.;Review the importance of being able to check your own pulse for safety during independent exercise       Expected Outcomes Short Term: Able to explain why pulse checking is important during independent exercise;Long Term: Able to check pulse independently and accurately       Understanding of Exercise Prescription Yes       Intervention Provide education, explanation, and written materials on patient's individual exercise prescription       Expected Outcomes Short Term: Able to explain program exercise prescription;Long Term: Able to explain home exercise prescription to exercise independently          Exercise Goals Re-Evaluation :   Discharge Exercise Prescription (Final Exercise Prescription Changes):     Exercise Prescription Changes - 05/23/17 1300      Response to Exercise   Blood Pressure (Admit) 122/66   Blood Pressure (Exercise) 164/74    Blood Pressure (Exit) 120/66   Heart Rate (Admit) 55 bpm   Heart Rate (Exercise) 80 bpm   Heart Rate (Exit) 66 bpm   Oxygen Saturation (Admit) 100 %   Oxygen Saturation (Exercise) 100 %   Rating of Perceived Exertion (Exercise) 12   Symptoms none   Comments walk test results      Nutrition:  Target Goals: Understanding of nutrition guidelines, daily intake of sodium 1500mg , cholesterol 200mg , calories 30% from fat and 7% or less from saturated fats, daily to have 5 or more servings of fruits and vegetables.  Biometrics:     Pre Biometrics - 05/23/17 1551      Pre Biometrics   Height 5' 9.5" (1.765 m)   Weight 222 lb (100.7 kg)   Waist Circumference 43.25 inches   Hip Circumference 40.5 inches   Waist to Hip Ratio 1.07 %   BMI (Calculated) 32.32   Single Leg Stand 30 seconds       Nutrition Therapy Plan and Nutrition Goals:   Nutrition Discharge: Rate Your Plate Scores:     Nutrition Assessments - 05/23/17 1233      MEDFICTS Scores   Pre Score 74      Nutrition Goals Re-Evaluation:   Nutrition Goals Discharge (Final Nutrition Goals Re-Evaluation):   Psychosocial: Target Goals: Acknowledge presence or absence of significant depression and/or stress, maximize coping skills, provide positive support system. Participant is able to verbalize types and ability to use techniques and skills needed for reducing stress and depression.   Initial Review & Psychosocial Screening:  Initial Psych Review & Screening - 05/23/17 1234      Initial Review   Current issues with Current Stress Concerns   Source of Stress Concerns Unable to participate in former interests or hobbies;Unable to perform yard/household activities     [place]? Yes     Screening Interventions   Interventions Yes;Encouraged to exercise;Program counselor consult   Expected Outcomes Short Term goal: Utilizing psychosocial counselor, staff and physician to assist  with identification of specific Stressors or current issues interfering with healing process. Setting desired goal for each stressor or current issue identified.;Long Term Goal: Stressors or current issues are controlled or eliminated.;Short Term goal: Identification and review with participant of any Quality of Life or Depression concerns found by scoring the questionnaire.;Long Term goal: The participant improves quality of Life and PHQ9 Scores as seen by post scores and/or verbalization of changes      Quality of Life Scores:      Quality of Life - 05/23/17 1235      Quality of Life Scores   Health/Function Pre 26.46 %   Socioeconomic Pre 28 %   Psych/Spiritual Pre 23.79 %   Family Pre 30 %   GLOBAL Pre 26.28 %      PHQ-9: Recent Review Flowsheet Data    Depression screen [facility] 2/9 05/23/2017   Decreased Interest 0   Down, Depressed, Hopeless 0   PHQ - 2 Score 0   Altered sleeping 0   Tired, decreased energy 0   Change in appetite 0   Feeling bad or failure about yourself  0   Trouble concentrating 0   Moving slowly or fidgety/restless 0   Suicidal thoughts 0   PHQ-9 Score 0   Difficult doing work/chores Not difficult at all     Interpretation of Total Score  Total Score Depression Severity:  1-4 = Minimal depression, 5-9 = Mild depression, 10-14 = Moderate depression, 15-19 = Moderately severe depression, 20-27 = Severe depression   Psychosocial Evaluation and Intervention:   Psychosocial Re-Evaluation:   Psychosocial Discharge (Final Psychosocial Re-Evaluation):   Vocational Rehabilitation: Provide vocational rehab assistance to qualifying candidates.   Vocational Rehab Evaluation & Intervention:     Vocational Rehab - 05/23/17 1238      Initial Vocational Rehab Evaluation & Intervention   Assessment shows need for Vocational Rehabilitation No      Education: Education Goals: Education classes will be provided on a variety of topics geared toward better  understanding of heart health and risk factor modification. Participant will state understanding/return demonstration of topics presented as noted by education test scores.  Learning Barriers/Preferences:     Learning Barriers/Preferences - 05/23/17 1237      Learning Barriers/Preferences   Learning Barriers None   Learning Preferences None      Education Topics: General Nutrition Guidelines/Fats and Fiber: -Group instruction provided by verbal, written material, models and posters to present the general guidelines for heart healthy nutrition. Gives an explanation and review of dietary fats and fiber.   Controlling Sodium/Reading Food Labels: -Group verbal and written material supporting the discussion of sodium use in heart healthy nutrition. Review and explanation with models, verbal and written materials for utilization of the food label.   Exercise Physiology & Risk Factors: - Group verbal and written instruction with models to review the exercise physiology of the cardiovascular system and associated critical values. Details cardiovascular disease risk factors and the goals associated with each risk factor.  Aerobic Exercise & Resistance Training: - Gives group verbal and written discussion on the health impact of inactivity. On the components of aerobic and resistive training programs and the benefits of this training and how to safely progress through these programs.   Flexibility, Balance, General Exercise Guidelines: - Provides group verbal and written instruction on the benefits of flexibility and balance training programs. Provides general exercise guidelines with specific guidelines to those with heart or lung disease. Demonstration and skill practice provided.   Stress Management: - Provides group verbal and written instruction about the health risks of elevated stress, cause of high stress, and healthy ways to reduce stress.   Depression: - Provides group verbal  and written instruction on the correlation between heart/lung disease and depressed mood, treatment options, and the stigmas associated with seeking treatment.   Anatomy & Physiology of the Heart: - Group verbal and written instruction and models provide basic cardiac anatomy and physiology, with the coronary electrical and arterial systems. Review of: AMI, Angina, Valve disease, Heart Failure, Cardiac Arrhythmia, Pacemakers, and the ICD.   Cardiac Procedures: - Group verbal and written instruction to review commonly prescribed medications for heart disease. Reviews the medication, class of the drug, and side effects. Includes the steps to properly store meds and maintain the prescription regimen. (beta blockers and nitrates)   Cardiac Medications I: - Group verbal and written instruction to review commonly prescribed medications for heart disease. Reviews the medication, class of the drug, and side effects. Includes the steps to properly store meds and maintain the prescription regimen.   Cardiac Medications II: -Group verbal and written instruction to review commonly prescribed medications for heart disease. Reviews the medication, class of the drug, and side effects. (all other drug classes)    Go Sex-Intimacy & Heart Disease, Get SMART - Goal Setting: - Group verbal and written instruction through game format to discuss heart disease and the return to sexual intimacy. Provides group verbal and written material to discuss and apply goal setting through the application of the S.M.A.R.T. Method.   Other Matters of the Heart: - Provides group verbal, written materials and models to describe Heart Failure, Angina, Valve Disease, Peripheral Artery Disease, and Diabetes in the realm of heart disease. Includes description of the disease process and treatment options available to the cardiac patient.   Exercise & Equipment Safety: - Individual verbal instruction and demonstration of equipment  use and safety with use of the equipment.   Cardiac Rehab from 05/23/2017 in [facility] Cardiac and Pulmonary Rehab  Date  05/23/17  Educator  [facility]  Instruction Review Code  1- Verbalizes Understanding      Infection Prevention: - Provides verbal and written material to individual with discussion of infection control including proper hand washing and proper equipment cleaning during exercise session.   Cardiac Rehab from 05/23/2017 in [facility] Cardiac and Pulmonary Rehab  Date  05/23/17  Educator  [facility]  Instruction Review Code  1- Verbalizes Understanding      Falls Prevention: - Provides verbal and written material to individual with discussion of falls prevention and safety.   Cardiac Rehab from 05/23/2017 in [facility] Cardiac and Pulmonary Rehab  Date  05/23/17  Educator  [facility]  Instruction Review Code  1- Verbalizes Understanding      Diabetes: - Individual verbal and written instruction to review signs/symptoms of diabetes, desired ranges of glucose level fasting, after meals and with exercise. Acknowledge that pre and post exercise glucose checks will be done for 3 sessions  at entry of program.   Other: -Provides group and verbal instruction on various topics (see comments)    Knowledge Questionnaire Score:     Knowledge Questionnaire Score - 05/23/17 1237      Knowledge Questionnaire Score   Pre Score 20/28  Correct answers reviewed with Marcello Moores      Core Components/Risk Factors/Patient Goals at Admission:     Personal Goals and Risk Factors at Admission - 05/23/17 1229      Core Components/Risk Factors/Patient Goals on Admission    Weight Management Yes;Obesity   Intervention Weight Management: Develop a combined nutrition and exercise program designed to reach desired caloric intake, while maintaining appropriate intake of nutrient and fiber, sodium and fats, and appropriate energy expenditure required for the weight goal.;Weight Management: Provide education and appropriate  resources to help participant work on and attain dietary goals.;Weight Management/Obesity: Establish reasonable short term and long term weight goals.;Obesity: Provide education and appropriate resources to help participant work on and attain dietary goals.   Admit Weight 222 lb (100.7 kg)   Goal Weight: Short Term 217 lb (98.4 kg)   Goal Weight: Long Term 180 lb (81.6 kg)   Expected Outcomes Short Term: Continue to assess and modify interventions until short term weight is achieved;Long Term: Adherence to nutrition and physical activity/exercise program aimed toward attainment of established weight goal;Weight Loss: Understanding of general recommendations for a balanced deficit meal plan, which promotes 1-2 lb weight loss per week and includes a negative energy balance of [phone] kcal/d;Understanding recommendations for meals to include 15-35% energy as protein, 25-35% energy from fat, 35-60% energy from carbohydrates, less than 200mg  of dietary cholesterol, 20-35 gm of total fiber daily;Understanding of distribution of calorie intake throughout the day with the consumption of 4-5 meals/snacks   Hypertension Yes   Intervention Provide education on lifestyle modifcations including regular physical activity/exercise, weight management, moderate sodium restriction and increased consumption of fresh fruit, vegetables, and low fat dairy, alcohol moderation, and smoking cessation.;Monitor prescription use compliance.   Expected Outcomes Short Term: Continued assessment and intervention until BP is < 140/52mm HG in hypertensive participants. < 130/57mm HG in hypertensive participants with diabetes, heart failure or chronic kidney disease.;Long Term: Maintenance of blood pressure at goal levels.   Lipids Yes   Intervention Provide education and support for participant on nutrition & aerobic/resistive exercise along with prescribed medications to achieve LDL 70mg , HDL >40mg .   Expected Outcomes Short Term:  Participant states understanding of desired cholesterol values and is compliant with medications prescribed. Participant is following exercise prescription and nutrition guidelines.;Long Term: Cholesterol controlled with medications as prescribed, with individualized exercise RX and with personalized nutrition plan. Value goals: LDL < 70mg , HDL > 40 mg.   Personal Goal Other Yes   Personal Goal Trinity would like to learn more about cardiac education because this event came as a surprise.    Intervention During [facility], education will be provided about heart disease and other components of the program   Expected Outcomes Short Term: Arling will attent [facility]; Long Term: Beryle will apply lifestyle changes that he has learned in [facility] and become more aware of heart disease      Core Components/Risk Factors/Patient Goals Review:    Core Components/Risk Factors/Patient Goals at Discharge (Final Review):    ITP Comments:     ITP Comments    Row Name 05/23/17 1223           ITP Comments Med Review Completed. Initial ITP created. Diagnosis can  be found in [facility] 05/04/17          Comments: Initial ITP

## 2017-05-27 DIAGNOSIS — Z955 Presence of coronary angioplasty implant and graft: Secondary | ICD-10-CM

## 2017-05-27 NOTE — Progress Notes (Signed)
Daily Session Note  Patient Details  Name: Billy Walker MRN: [id number] Date of Birth: 06-07-1946 Referring Provider:     Cardiac Rehab from 05/23/2017 in [facility] Cardiac and Pulmonary Rehab  Referring Provider  Isaias Cowman MD      Encounter Date: 05/27/2017  Check In:     Session Check In - 05/27/17 0838      Check-In   Location [facility]-Cardiac & Pulmonary Rehab   Staff Present Gerlene Burdock, RN, BSN;Jessica Luan Pulling, MA, ACSM RCEP, Exercise Physiologist;Adeola Dennen Oletta Darter, BA, ACSM CEP, Exercise Physiologist   Supervising physician immediately available to respond to emergencies See telemetry face sheet for immediately available ER MD   Medication changes reported     No   Fall or balance concerns reported    No   Warm-up and Cool-down Performed on first and last piece of equipment   Resistance Training Performed Yes   VAD Patient? No     Pain Assessment   Currently in Pain? No/denies         History  Smoking Status  . Never Smoker  Smokeless Tobacco  . Never Used    Goals Met:  Independence with exercise equipment Exercise tolerated well No report of cardiac concerns or symptoms Strength training completed today  Goals Unmet:  Not Applicable  Comments: First full day of exercise!  Patient was oriented to gym and equipment including functions, settings, policies, and procedures.  Patient's individual exercise prescription and treatment plan were reviewed.  All starting workloads were established based on the results of the 6 minute walk test done at initial orientation visit.  The plan for exercise progression was also introduced and progression will be customized based on patient's performance and goals.    Dr. Emily Filbert is Medical Director for [place] and [facility].

## 2017-05-30 ENCOUNTER — Encounter: Payer: Medicare Other | Admitting: *Deleted

## 2017-05-30 DIAGNOSIS — Z955 Presence of coronary angioplasty implant and graft: Secondary | ICD-10-CM | POA: Diagnosis not present

## 2017-05-30 NOTE — Progress Notes (Signed)
Daily Session Note  Patient Details  Name: Billy Walker MRN: [id number] Date of Birth: 09/13/1945 Referring Provider:     Cardiac Rehab from 05/23/2017 in [facility] Cardiac and Pulmonary Rehab  Referring Provider  Isaias Cowman MD      Encounter Date: 05/30/2017  Check In:     Session Check In - 05/30/17 0802      Check-In   Location [facility]-Cardiac & Pulmonary Rehab   Staff Present Earlean Shawl, BS, ACSM CEP, Exercise Physiologist;Carroll Enterkin, RN, Levie Heritage, MA, ACSM RCEP, Exercise Physiologist   Supervising physician immediately available to respond to emergencies See telemetry face sheet for immediately available ER MD   Medication changes reported     No   Fall or balance concerns reported    No   Warm-up and Cool-down Performed on first and last piece of equipment   Resistance Training Performed Yes   VAD Patient? No     Pain Assessment   Currently in Pain? No/denies   Multiple Pain Sites No         History  Smoking Status  . Never Smoker  Smokeless Tobacco  . Never Used    Goals Met:  Independence with exercise equipment Exercise tolerated well No report of cardiac concerns or symptoms Strength training completed today  Goals Unmet:  Not Applicable  Comments: Pt able to follow exercise prescription today without complaint.  Will continue to monitor for progression.    Dr. Emily Filbert is Medical Director for [place] and [facility].

## 2017-06-01 ENCOUNTER — Encounter: Payer: Self-pay | Admitting: *Deleted

## 2017-06-01 DIAGNOSIS — Z955 Presence of coronary angioplasty implant and graft: Secondary | ICD-10-CM | POA: Diagnosis not present

## 2017-06-01 NOTE — Progress Notes (Signed)
Cardiac Individual Treatment Plan  Patient Details  Name: Billy Walker MRN: [id number] Date of Birth: 31-May-1946 Referring Provider:     Cardiac Rehab from 05/23/2017 in [facility] Cardiac and Pulmonary Rehab  Referring Provider  Isaias Cowman MD      Initial Encounter Date:    Cardiac Rehab from 05/23/2017 in [facility] Cardiac and Pulmonary Rehab  Date  05/23/17  Referring Provider  Isaias Cowman MD      Visit Diagnosis: No diagnosis found.  Patient's Home Medications on Admission:  Current Outpatient Prescriptions:  .  aspirin EC 81 MG tablet, Take 81 mg by mouth daily., Disp: , Rfl:  .  atorvastatin (LIPITOR) 40 MG tablet, Take 1 tablet (40 mg total) by mouth daily at 6 PM., Disp: 30 tablet, Rfl: 0 .  clopidogrel (PLAVIX) 75 MG tablet, Take 75 mg by mouth at bedtime., Disp: , Rfl:  .  enalapril (VASOTEC) 5 MG tablet, Take 1 tablet (5 mg total) by mouth daily., Disp: 30 tablet, Rfl: 0 .  metoprolol tartrate (LOPRESSOR) 25 MG tablet, Take 1 tablet (25 mg total) by mouth 2 (two) times daily., Disp: 60 tablet, Rfl: 0 .  nitroGLYCERIN (NITROSTAT) 0.4 MG SL tablet, Place 1 tablet (0.4 mg total) under the tongue every 5 (five) minutes as needed for chest pain., Disp: 30 tablet, Rfl: 0  Past Medical History: Past Medical History:  Diagnosis Date  . Hypertension   . Prostate cancer ([place])     Tobacco Use: History  Smoking Status  . Never Smoker  Smokeless Tobacco  . Never Used    Labs: Recent Review Flowsheet Data    Labs for ITP Cardiac and Pulmonary Rehab Latest Ref Rng & Units 04/10/2017 04/11/2017   Cholestrol 0 - 200 mg/dL 195 171   LDLCALC 0 - 99 mg/dL 125(H) 115(H)   HDL >40 mg/dL 34(L) 29(L)   Trlycerides <150 mg/dL 182(H) 133       Exercise Target Goals:    Exercise Program Goal: Individual exercise prescription set with THRR, safety & activity barriers. Participant demonstrates ability to understand and report RPE using BORG scale, to self-measure pulse  accurately, and to acknowledge the importance of the exercise prescription.  Exercise Prescription Goal: Starting with aerobic activity 30 plus minutes a day, 3 days per week for initial exercise prescription. Provide home exercise prescription and guidelines that participant acknowledges understanding prior to discharge.  Activity Barriers & Risk Stratification:     Activity Barriers & Cardiac Risk Stratification - 05/23/17 1239      Activity Barriers & Cardiac Risk Stratification   Activity Barriers Joint Problems;Deconditioning;Muscular Weakness  occasional left knee pain   Cardiac Risk Stratification Moderate      6 Minute Walk:     6 Minute Walk    Row Name 05/23/17 1546         6 Minute Walk   Phase Initial     Distance 1550 feet     Walk Time 6 minutes     # of Rest Breaks 0     MPH 2.94     METS 2.99     RPE 12     Perceived Dyspnea  12     VO2 Peak 10.49     Symptoms No     Resting HR 55 bpm     Resting BP 122/66     Resting Oxygen Saturation  100 %     Exercise Oxygen Saturation  during 6 min walk 100 %  Max Ex. HR 78 bpm     Max Ex. BP 164/74     2 Minute Post BP 120/66        Oxygen Initial Assessment:   Oxygen Re-Evaluation:   Oxygen Discharge (Final Oxygen Re-Evaluation):   Initial Exercise Prescription:     Initial Exercise Prescription - 05/23/17 1500      Date of Initial Exercise RX and Referring Provider   Date 05/23/17   Referring Provider Isaias Cowman MD     Treadmill   MPH 2.4   Grade 0.5   Minutes 15   METs 3     Bike   Level 1.1   Watts 32   Minutes 15   METs 2.9     NuStep   Level 3   SPM 80   Minutes 15   METs 3     Prescription Details   Frequency (times per week) 3   Duration Progress to 45 minutes of aerobic exercise without signs/symptoms of physical distress     Intensity   THRR 40-80% of Max Heartrate 93-130   Ratings of Perceived Exertion 11-13   Perceived Dyspnea 0-4     Progression    Progression Continue to progress workloads to maintain intensity without signs/symptoms of physical distress.     Resistance Training   Training Prescription Yes   Weight 4 lbs   Reps 10-15      Perform Capillary Blood Glucose checks as needed.  Exercise Prescription Changes:     Exercise Prescription Changes    Row Name 05/23/17 1300             Response to Exercise   Blood Pressure (Admit) 122/66       Blood Pressure (Exercise) 164/74       Blood Pressure (Exit) 120/66       Heart Rate (Admit) 55 bpm       Heart Rate (Exercise) 80 bpm       Heart Rate (Exit) 66 bpm       Oxygen Saturation (Admit) 100 %       Oxygen Saturation (Exercise) 100 %       Rating of Perceived Exertion (Exercise) 12       Symptoms none       Comments walk test results          Exercise Comments:     Exercise Comments    Row Name 05/27/17 0839           Exercise Comments First full day of exercise!  Patient was oriented to gym and equipment including functions, settings, policies, and procedures.  Patient's individual exercise prescription and treatment plan were reviewed.  All starting workloads were established based on the results of the 6 minute walk test done at initial orientation visit.  The plan for exercise progression was also introduced and progression will be customized based on patient's performance and goals.          Exercise Goals and Review:     Exercise Goals    Row Name 05/23/17 1550             Exercise Goals   Increase Physical Activity Yes       Intervention Provide advice, education, support and counseling about physical activity/exercise needs.;Develop an individualized exercise prescription for aerobic and resistive training based on initial evaluation findings, risk stratification, comorbidities and participant's personal goals.       Expected Outcomes Achievement of increased cardiorespiratory fitness and enhanced  flexibility, muscular endurance and  strength shown through measurements of functional capacity and personal statement of participant.       Increase Strength and Stamina Yes       Intervention Provide advice, education, support and counseling about physical activity/exercise needs.;Develop an individualized exercise prescription for aerobic and resistive training based on initial evaluation findings, risk stratification, comorbidities and participant's personal goals.       Expected Outcomes Achievement of increased cardiorespiratory fitness and enhanced flexibility, muscular endurance and strength shown through measurements of functional capacity and personal statement of participant.       Able to understand and use rate of perceived exertion (RPE) scale Yes       Intervention Provide education and explanation on how to use RPE scale       Expected Outcomes Short Term: Able to use RPE daily in rehab to express subjective intensity level;Long Term:  Able to use RPE to guide intensity level when exercising independently       Knowledge and understanding of Target Heart Rate Range (THRR) Yes       Intervention Provide education and explanation of THRR including how the numbers were predicted and where they are located for reference       Expected Outcomes Short Term: Able to state/look up THRR;Long Term: Able to use THRR to govern intensity when exercising independently;Short Term: Able to use daily as guideline for intensity in rehab       Able to check pulse independently Yes       Intervention Provide education and demonstration on how to check pulse in carotid and radial arteries.;Review the importance of being able to check your own pulse for safety during independent exercise       Expected Outcomes Short Term: Able to explain why pulse checking is important during independent exercise;Long Term: Able to check pulse independently and accurately       Understanding of Exercise Prescription Yes       Intervention Provide education,  explanation, and written materials on patient's individual exercise prescription       Expected Outcomes Short Term: Able to explain program exercise prescription;Long Term: Able to explain home exercise prescription to exercise independently          Exercise Goals Re-Evaluation :     Exercise Goals Re-Evaluation    Row Name 05/27/17 [phone]             Exercise Goal Re-Evaluation   Exercise Goals Review Able to understand and use rate of perceived exertion (RPE) scale;Knowledge and understanding of Target Heart Rate Range (THRR);Understanding of Exercise Prescription       Comments Reviewed RPE scale, THR and program prescription with pt today.  Pt voiced understanding and was given a copy of goals to take home.        Expected Outcomes Short: Use RPE daily to regulate intensity.  Long: Follow program prescription in THR.          Discharge Exercise Prescription (Final Exercise Prescription Changes):     Exercise Prescription Changes - 05/23/17 1300      Response to Exercise   Blood Pressure (Admit) 122/66   Blood Pressure (Exercise) 164/74   Blood Pressure (Exit) 120/66   Heart Rate (Admit) 55 bpm   Heart Rate (Exercise) 80 bpm   Heart Rate (Exit) 66 bpm   Oxygen Saturation (Admit) 100 %   Oxygen Saturation (Exercise) 100 %   Rating of Perceived Exertion (Exercise) 12   Symptoms  none   Comments walk test results      Nutrition:  Target Goals: Understanding of nutrition guidelines, daily intake of sodium <1553m, cholesterol <2010m calories 30% from fat and 7% or less from saturated fats, daily to have 5 or more servings of fruits and vegetables.  Biometrics:     Pre Biometrics - 05/23/17 1551      Pre Biometrics   Height 5' 9.5" (1.765 m)   Weight 222 lb (100.7 kg)   Waist Circumference 43.25 inches   Hip Circumference 40.5 inches   Waist to Hip Ratio 1.07 %   BMI (Calculated) 32.32   Single Leg Stand 30 seconds       Nutrition Therapy Plan and Nutrition  Goals:   Nutrition Discharge: Rate Your Plate Scores:     Nutrition Assessments - 05/23/17 1233      MEDFICTS Scores   Pre Score 74      Nutrition Goals Re-Evaluation:   Nutrition Goals Discharge (Final Nutrition Goals Re-Evaluation):   Psychosocial: Target Goals: Acknowledge presence or absence of significant depression and/or stress, maximize coping skills, provide positive support system. Participant is able to verbalize types and ability to use techniques and skills needed for reducing stress and depression.   Initial Review & Psychosocial Screening:     Initial Psych Review & Screening - 05/23/17 1234      Initial Review   Current issues with Current Stress Concerns   Source of Stress Concerns Unable to participate in former interests or hobbies;Unable to perform yard/household activities     Fa[place]Yes     Screening Interventions   Interventions Yes;Encouraged to exercise;Program counselor consult   Expected Outcomes Short Term goal: Utilizing psychosocial counselor, staff and physician to assist with identification of specific Stressors or current issues interfering with healing process. Setting desired goal for each stressor or current issue identified.;Long Term Goal: Stressors or current issues are controlled or eliminated.;Short Term goal: Identification and review with participant of any Quality of Life or Depression concerns found by scoring the questionnaire.;Long Term goal: The participant improves quality of Life and PHQ9 Scores as seen by post scores and/or verbalization of changes      Quality of Life Scores:      Quality of Life - 05/23/17 1235      Quality of Life Scores   Health/Function Pre 26.46 %   Socioeconomic Pre 28 %   Psych/Spiritual Pre 23.79 %   Family Pre 30 %   GLOBAL Pre 26.28 %      PHQ-9: Recent Review Flowsheet Data    [facility] 05[facility]   Decreased Interest 0   Down,  Depressed, Hopeless 0   PHQ - 2 Score 0   Altered sleeping 0   Tired, decreased energy 0   Change in appetite 0   Feeling bad or failure about yourself  0   Trouble concentrating 0   Moving slowly or fidgety/restless 0   Suicidal thoughts 0   PHQ-9 Score 0   Difficult doing work/chores Not difficult at all     Interpretation of Total Score  Total Score Depression Severity:  1-4 = Minimal depression, 5-9 = Mild depression, 10-14 = Moderate depression, 15-19 = Moderately severe depression, 20-27 = Severe depression   Psychosocial Evaluation and Intervention:     Psychosocial Evaluation - 05/30/17 0934      Psychosocial Evaluation & Interventions   Interventions Encouraged to exercise with the program and  follow exercise prescription;Stress management education;Relaxation education   Comments Counselor met with Mr. Carpenter [phone]) today for initial psychosocial evaluation.  He is a 71 year old who had a heart attack and (2) stents inserted on 04/08/17.  Tommy lives alone but has relatives, friends and neighbors close by who are his primary support system.  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  Tommy reports sleeping well and having a good appetite.  He denies a history of depression or anxiety or any current symptoms.  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  He has goals to lose weight; feel better; be heart healthier and feel more comfortable working out without fear of another heart attack.  Staff will follow with Tommy throughout the course of this program.     Expected Outcomes Tommy will benefit from consistent exercise to achieve his stated goals.  He will meet with the dietician to address his weight loss goals.  Also, the educational and psychoeducational components of this program will be helpful to learn more about his condition and ways to manage and  cope more positively.  Staff will follow with Tommy.      Psychosocial Re-Evaluation:   Psychosocial Discharge (Final Psychosocial Re-Evaluation):   Vocational Rehabilitation: Provide vocational rehab assistance to qualifying candidates.   Vocational Rehab Evaluation & Intervention:     Vocational Rehab - 05/23/17 1238      Initial Vocational Rehab Evaluation & Intervention   Assessment shows need for Vocational Rehabilitation No      Education: Education Goals: Education classes will be provided on a variety of topics geared toward better understanding of heart health and risk factor modification. Participant will state understanding/return demonstration of topics presented as noted by education test scores.  Learning Barriers/Preferences:     Learning Barriers/Preferences - 05/23/17 1237      Learning Barriers/Preferences   Learning Barriers None   Learning Preferences None      Education Topics: General Nutrition Guidelines/Fats and Fiber: -Group instruction provided by verbal, written material, models and posters to present the general guidelines for heart healthy nutrition. Gives an explanation and review of dietary fats and fiber.   Controlling Sodium/Reading Food Labels: -Group verbal and written material supporting the discussion of sodium use in heart healthy nutrition. Review and explanation with models, verbal and written materials for utilization of the food label.   Exercise Physiology & Risk Factors: - Group verbal and written instruction with models to review the exercise physiology of the cardiovascular system and associated critical values. Details cardiovascular disease risk factors and the goals associated with each risk factor.   Aerobic Exercise & Resistance Training: - Gives group verbal and written discussion on the health impact of inactivity. On the components of aerobic and resistive training programs and the benefits of this training and  how to safely progress through these programs.   Flexibility, Balance, General Exercise Guidelines: - Provides group verbal and written instruction on the benefits of flexibility and balance training programs. Provides general exercise guidelines with specific guidelines to those with heart or lung disease. Demonstration and skill practice provided.   Stress Management: - Provides group verbal and written instruction about the health risks of elevated stress, cause of high stress, and healthy ways to reduce stress.   Depression: - Provides group verbal and written instruction on the correlation between heart/lung  disease and depressed mood, treatment options, and the stigmas associated with seeking treatment.   Anatomy & Physiology of the Heart: - Group verbal and written instruction and models provide basic cardiac anatomy and physiology, with the coronary electrical and arterial systems. Review of: AMI, Angina, Valve disease, Heart Failure, Cardiac Arrhythmia, Pacemakers, and the ICD.   Cardiac Procedures: - Group verbal and written instruction to review commonly prescribed medications for heart disease. Reviews the medication, class of the drug, and side effects. Includes the steps to properly store meds and maintain the prescription regimen. (beta blockers and nitrates)   Cardiac Medications I: - Group verbal and written instruction to review commonly prescribed medications for heart disease. Reviews the medication, class of the drug, and side effects. Includes the steps to properly store meds and maintain the prescription regimen.   Cardiac Medications II: -Group verbal and written instruction to review commonly prescribed medications for heart disease. Reviews the medication, class of the drug, and side effects. (all other drug classes)    Go Sex-Intimacy & Heart Disease, Get SMART - Goal Setting: - Group verbal and written instruction through game format to discuss heart  disease and the return to sexual intimacy. Provides group verbal and written material to discuss and apply goal setting through the application of the S.M.A.R.T. Method.   Other Matters of the Heart: - Provides group verbal, written materials and models to describe Heart Failure, Angina, Valve Disease, Peripheral Artery Disease, and Diabetes in the realm of heart disease. Includes description of the disease process and treatment options available to the cardiac patient.   Exercise & Equipment Safety: - Individual verbal instruction and demonstration of equipment use and safety with use of the equipment.   Cardiac Rehab from 05/30/2017 in [facility] Cardiac and Pulmonary Rehab  Date  05/23/17  Educator  [facility]  Instruction Review Code  1- Verbalizes Understanding      Infection Prevention: - Provides verbal and written material to individual with discussion of infection control including proper hand washing and proper equipment cleaning during exercise session.   Cardiac Rehab from 05/30/2017 in [facility] Cardiac and Pulmonary Rehab  Date  05/23/17  Educator  [facility]  Instruction Review Code  1- Verbalizes Understanding      Falls Prevention: - Provides verbal and written material to individual with discussion of falls prevention and safety.   Cardiac Rehab from 05/30/2017 in [facility] Cardiac and Pulmonary Rehab  Date  05/23/17  Educator  [facility]  Instruction Review Code  1- Verbalizes Understanding      Diabetes: - Individual verbal and written instruction to review signs/symptoms of diabetes, desired ranges of glucose level fasting, after meals and with exercise. Acknowledge that pre and post exercise glucose checks will be done for 3 sessions at entry of program.   Other: -Provides group and verbal instruction on various topics (see comments)    Knowledge Questionnaire Score:     Knowledge Questionnaire Score - 05/23/17 1237      Knowledge Questionnaire Score   Pre Score 20/28  Correct answers  reviewed with Marcello Moores      Core Components/Risk Factors/Patient Goals at Admission:     Personal Goals and Risk Factors at Admission - 05/23/17 1229      Core Components/Risk Factors/Patient Goals on Admission    Weight Management Yes;Obesity   Intervention Weight Management: Develop a combined nutrition and exercise program designed to reach desired caloric intake, while maintaining appropriate intake of nutrient and fiber, sodium and fats, and appropriate energy  expenditure required for the weight goal.;Weight Management: Provide education and appropriate resources to help participant work on and attain dietary goals.;Weight Management/Obesity: Establish reasonable short term and long term weight goals.;Obesity: Provide education and appropriate resources to help participant work on and attain dietary goals.   Admit Weight 222 lb (100.7 kg)   Goal Weight: Short Term 217 lb (98.4 kg)   Goal Weight: Long Term 180 lb (81.6 kg)   Expected Outcomes Short Term: Continue to assess and modify interventions until short term weight is achieved;Long Term: Adherence to nutrition and physical activity/exercise program aimed toward attainment of established weight goal;Weight Loss: Understanding of general recommendations for a balanced deficit meal plan, which promotes 1-2 lb weight loss per week and includes a negative energy balance of [phone] kcal/d;Understanding recommendations for meals to include 15-35% energy as protein, 25-35% energy from fat, 35-60% energy from carbohydrates, less than 265m of dietary cholesterol, 20-35 gm of total fiber daily;Understanding of distribution of calorie intake throughout the day with the consumption of 4-5 meals/snacks   Hypertension Yes   Intervention Provide education on lifestyle modifcations including regular physical activity/exercise, weight management, moderate sodium restriction and increased consumption of fresh fruit, vegetables, and low fat dairy, alcohol  moderation, and smoking cessation.;Monitor prescription use compliance.   Expected Outcomes Short Term: Continued assessment and intervention until BP is < 140/945mHG in hypertensive participants. < 130/8013mG in hypertensive participants with diabetes, heart failure or chronic kidney disease.;Long Term: Maintenance of blood pressure at goal levels.   Lipids Yes   Intervention Provide education and support for participant on nutrition & aerobic/resistive exercise along with prescribed medications to achieve LDL <48m35mDL >40mg53mExpected Outcomes Short Term: Participant states understanding of desired cholesterol values and is compliant with medications prescribed. Participant is following exercise prescription and nutrition guidelines.;Long Term: Cholesterol controlled with medications as prescribed, with individualized exercise RX and with personalized nutrition plan. Value goals: LDL < 48mg,11m > 40 mg.   Personal Goal Other Yes   Personal Goal ThomasKyair like to learn more about cardiac education because this event came as a surprise.    Intervention During [facility], education will be provided about heart disease and other components of the program   Expected Outcomes Short Term: ThomasOriattent [facility]; Long Term: ThomasNiteshapply lifestyle changes that he has [facility]      Core Components/Risk Factors/Patient Goals Review:    Core Components/Risk Factors/Patient Goals at Discharge (Final Review):    ITP Comments:     ITP Comments    Row Name 05/23/17 1223 06/01/17 0624         ITP Comments Med Review Completed. Initial ITP created. Diagnosis can be found in [facility] 30 day Review. Continue with ITP unless directed changes per Medical Director Review.          Comments:

## 2017-06-01 NOTE — Progress Notes (Signed)
Daily Session Note  Patient Details  Name: Billy Walker MRN: [id number] Date of Birth: October 26, 1945 Referring Provider:     Cardiac Rehab from 05/23/2017 in [facility] Cardiac and Pulmonary Rehab  Referring Provider  Isaias Cowman MD      Encounter Date: 06/01/2017  Check In:     Session Check In - 06/01/17 0759      Check-In   Location [facility]-Cardiac & Pulmonary Rehab   Staff Present Nyoka Cowden, RN, BSN, Willette Pa, MA, ACSM RCEP, Exercise Physiologist;Joseph Flavia Shipper   Supervising physician immediately available to respond to emergencies See telemetry face sheet for immediately available ER MD   Medication changes reported     No   Fall or balance concerns reported    No   Warm-up and Cool-down Performed on first and last piece of equipment   Resistance Training Performed Yes   VAD Patient? No     Pain Assessment   Currently in Pain? No/denies   Multiple Pain Sites No         History  Smoking Status  . Never Smoker  Smokeless Tobacco  . Never Used    Goals Met:  Exercise tolerated well No report of cardiac concerns or symptoms Strength training completed today  Goals Unmet:  Not Applicable  Comments: Reviewed home exercise with pt today.  Pt plans to walk 2 extra days a week for exercise.  Reviewed THR, pulse, RPE, sign and symptoms, NTG use, and when to call 911 or MD.  Also discussed weather considerations and indoor options.  Pt voiced understanding..   Dr. Emily Filbert is Medical Director for [place] and [facility].

## 2017-06-03 ENCOUNTER — Encounter: Payer: Medicare Other | Admitting: *Deleted

## 2017-06-03 DIAGNOSIS — Z955 Presence of coronary angioplasty implant and graft: Secondary | ICD-10-CM | POA: Diagnosis not present

## 2017-06-03 NOTE — Progress Notes (Signed)
Daily Session Note  Patient Details  Name: Billy Walker MRN: [id number] Date of Birth: 1946/06/27 Referring Provider:     Cardiac Rehab from 05/23/2017 in [facility] Cardiac and Pulmonary Rehab  Referring Provider  Isaias Cowman MD      Encounter Date: 06/03/2017  Check In:     Session Check In - 06/03/17 0808      Check-In   Location [facility]-Cardiac & Pulmonary Rehab   Staff Present Gerlene Burdock, RN, BSN;Emile Kyllo Luan Pulling, MA, ACSM RCEP, Exercise Physiologist;Amanda Oletta Darter, BA, ACSM CEP, Exercise Physiologist   Supervising physician immediately available to respond to emergencies See telemetry face sheet for immediately available ER MD   Medication changes reported     No   Fall or balance concerns reported    No   Warm-up and Cool-down Performed on first and last piece of equipment   Resistance Training Performed Yes   VAD Patient? No     Pain Assessment   Currently in Pain? No/denies   Multiple Pain Sites No         History  Smoking Status  . Never Smoker  Smokeless Tobacco  . Never Used    Goals Met:  Independence with exercise equipment Exercise tolerated well No report of cardiac concerns or symptoms Strength training completed today  Goals Unmet:  Not Applicable  Comments: Pt able to follow exercise prescription today without complaint.  Will continue to monitor for progression.    Dr. Emily Filbert is Medical Director for [place] and [facility].

## 2017-06-06 ENCOUNTER — Encounter: Payer: Medicare Other | Admitting: *Deleted

## 2017-06-06 DIAGNOSIS — Z955 Presence of coronary angioplasty implant and graft: Secondary | ICD-10-CM | POA: Diagnosis not present

## 2017-06-06 NOTE — Progress Notes (Signed)
Daily Session Note  Patient Details  Name: Billy Walker MRN: [id number] Date of Birth: October 03, 1945 Referring Provider:     Cardiac Rehab from 05/23/2017 in [facility] Cardiac and Pulmonary Rehab  Referring Provider  Isaias Cowman MD      Encounter Date: 06/06/2017  Check In:     Session Check In - 06/06/17 0821      Check-In   Location [facility]-Cardiac & Pulmonary Rehab   Staff Present Earlean Shawl, BS, ACSM CEP, Exercise Physiologist;Carroll Enterkin, RN, BSN;Laureen Owens Shark, BS, RRT, Respiratory Therapist   Supervising physician immediately available to respond to emergencies See telemetry face sheet for immediately available ER MD   Medication changes reported     No   Fall or balance concerns reported    No   Warm-up and Cool-down Performed on first and last piece of equipment   Resistance Training Performed Yes   VAD Patient? No     Pain Assessment   Currently in Pain? No/denies   Multiple Pain Sites No         History  Smoking Status  . Never Smoker  Smokeless Tobacco  . Never Used    Goals Met:  Independence with exercise equipment Exercise tolerated well No report of cardiac concerns or symptoms Strength training completed today  Goals Unmet:  Not Applicable  Comments: Pt able to follow exercise prescription today without complaint.  Will continue to monitor for progression.    Dr. Emily Filbert is Medical Director for [place] and [facility].

## 2017-06-08 DIAGNOSIS — Z955 Presence of coronary angioplasty implant and graft: Secondary | ICD-10-CM | POA: Diagnosis not present

## 2017-06-08 NOTE — Progress Notes (Signed)
Daily Session Note  Patient Details  Name: MALIK PAAR MRN: [id number] Date of Birth: 09/29/1945 Referring Provider:     Cardiac Rehab from 05/23/2017 in [facility] Cardiac and Pulmonary Rehab  Referring Provider  Isaias Cowman MD      Encounter Date: 06/08/2017  Check In:     Session Check In - 06/08/17 0738      Check-In   Location [facility]-Cardiac & Pulmonary Rehab   Staff Present Nada Maclachlan, BA, ACSM CEP, Exercise Physiologist;Krista Frederico Hamman, RN BSN;Lala Been Flavia Shipper   Supervising physician immediately available to respond to emergencies See telemetry face sheet for immediately available ER MD   Medication changes reported     No   Fall or balance concerns reported    No   Warm-up and Cool-down Performed as group-led instruction   Resistance Training Performed Yes   VAD Patient? No     Pain Assessment   Currently in Pain? No/denies   Multiple Pain Sites No         History  Smoking Status  . Never Smoker  Smokeless Tobacco  . Never Used    Goals Met:  Independence with exercise equipment Exercise tolerated well No report of cardiac concerns or symptoms Strength training completed today  Goals Unmet:  Not Applicable  Comments: Pt able to follow exercise prescription today without complaint.  Will continue to monitor for progression.   Dr. Emily Filbert is Medical Director for [place] and [facility].

## 2017-06-10 ENCOUNTER — Encounter: Payer: Medicare Other | Admitting: *Deleted

## 2017-06-10 DIAGNOSIS — Z955 Presence of coronary angioplasty implant and graft: Secondary | ICD-10-CM | POA: Diagnosis not present

## 2017-06-10 NOTE — Progress Notes (Signed)
Daily Session Note  Patient Details  Name: Billy Walker MRN: [id number] Date of Birth: 08-03-1946 Referring Provider:     Cardiac Rehab from 05/23/2017 in [facility] Cardiac and Pulmonary Rehab  Referring Provider  Isaias Cowman MD      Encounter Date: 06/10/2017  Check In:     Session Check In - 06/10/17 0902      Check-In   Location [facility]-Cardiac & Pulmonary Rehab   Staff Present Nada Maclachlan, BA, ACSM CEP, Exercise Physiologist;Krista Frederico Hamman, RN BSN;Meredith Sherryll Burger, RN BSN   Supervising physician immediately available to respond to emergencies See telemetry face sheet for immediately available ER MD   Medication changes reported     No   Fall or balance concerns reported    No   Tobacco Cessation No Change   Warm-up and Cool-down Performed on first and last piece of equipment   Resistance Training Performed Yes   VAD Patient? No     Pain Assessment   Currently in Pain? No/denies   Multiple Pain Sites No         History  Smoking Status  . Never Smoker  Smokeless Tobacco  . Never Used    Goals Met:  Independence with exercise equipment Exercise tolerated well No report of cardiac concerns or symptoms Strength training completed today  Goals Unmet:  Not Applicable  Comments: Pt able to follow exercise prescription today without complaint.  Will continue to monitor for progression.    Dr. Emily Filbert is Medical Director for [place] and [facility].

## 2017-06-13 ENCOUNTER — Encounter: Payer: Medicare Other | Admitting: *Deleted

## 2017-06-13 DIAGNOSIS — Z955 Presence of coronary angioplasty implant and graft: Secondary | ICD-10-CM

## 2017-06-13 NOTE — Progress Notes (Signed)
Daily Session Note  Patient Details  Name: Billy Walker MRN: [id number] Date of Birth: 1946/06/15 Referring Provider:     Cardiac Rehab from 05/23/2017 in [facility] Cardiac and Pulmonary Rehab  Referring Provider  Isaias Cowman MD      Encounter Date: 06/13/2017  Check In:     Session Check In - 06/13/17 0839      Check-In   Location [facility]-Cardiac & Pulmonary Rehab   Staff Present Earlean Shawl, BS, ACSM CEP, Exercise Physiologist;Carroll Enterkin, RN, Levie Heritage, MA, ACSM RCEP, Exercise Physiologist   Supervising physician immediately available to respond to emergencies See telemetry face sheet for immediately available ER MD   Medication changes reported     No   Fall or balance concerns reported    No   Warm-up and Cool-down Performed on first and last piece of equipment   Resistance Training Performed Yes   VAD Patient? No     Pain Assessment   Currently in Pain? No/denies   Multiple Pain Sites No         History  Smoking Status  . Never Smoker  Smokeless Tobacco  . Never Used    Goals Met:  Independence with exercise equipment Exercise tolerated well No report of cardiac concerns or symptoms Strength training completed today  Goals Unmet:  Not Applicable  Comments: Pt able to follow exercise prescription today without complaint.  Will continue to monitor for progression.    Dr. Emily Filbert is Medical Director for [place] and [facility].

## 2017-06-15 DIAGNOSIS — Z955 Presence of coronary angioplasty implant and graft: Secondary | ICD-10-CM | POA: Diagnosis not present

## 2017-06-15 NOTE — Progress Notes (Signed)
Daily Session Note  Patient Details  Name: Billy Walker MRN: [id number] Date of Birth: 1946/07/08 Referring Provider:     Cardiac Rehab from 05/23/2017 in [facility] Cardiac and Pulmonary Rehab  Referring Provider  Isaias Cowman MD      Encounter Date: 06/15/2017  Check In:     Session Check In - 06/15/17 0730      Check-In   Location [facility]-Cardiac & Pulmonary Rehab   Staff Present Alberteen Sam, MA, ACSM RCEP, Exercise Physiologist;Susanne Bice, RN, BSN, CCRP;Adelard Sanon Flavia Shipper   Supervising physician immediately available to respond to emergencies See telemetry face sheet for immediately available ER MD   Medication changes reported     No   Fall or balance concerns reported    No   Warm-up and Cool-down Performed on first and last piece of equipment   Resistance Training Performed Yes   VAD Patient? No     Pain Assessment   Currently in Pain? No/denies   Multiple Pain Sites No         History  Smoking Status  . Never Smoker  Smokeless Tobacco  . Never Used    Goals Met:  Independence with exercise equipment Exercise tolerated well Personal goals reviewed No report of cardiac concerns or symptoms Strength training completed today  Goals Unmet:  Not Applicable  Comments: Pt able to follow exercise prescription today without complaint.  Will continue to monitor for progression.   Dr. Emily Filbert is Medical Director for [name] and [facility].

## 2017-06-17 DIAGNOSIS — Z955 Presence of coronary angioplasty implant and graft: Secondary | ICD-10-CM

## 2017-06-17 NOTE — Progress Notes (Signed)
Daily Session Note  Patient Details  Name: Billy Walker MRN: [id number] Date of Birth: Dec 31, 1945 Referring Provider:     Cardiac Rehab from 05/23/2017 in [facility] Cardiac and Pulmonary Rehab  Referring Provider  Isaias Cowman MD      Encounter Date: 06/17/2017  Check In:     Session Check In - 06/17/17 0806      Check-In   Location [facility]-Cardiac & Pulmonary Rehab   Staff Present Alberteen Sam, MA, ACSM RCEP, Exercise Physiologist;Amanda Oletta Darter, BA, ACSM CEP, Exercise Physiologist;Meredith Sherryll Burger, RN BSN   Supervising physician immediately available to respond to emergencies See telemetry face sheet for immediately available ER MD   Medication changes reported     No   Fall or balance concerns reported    No   Warm-up and Cool-down Performed on first and last piece of equipment   Resistance Training Performed Yes   VAD Patient? No     Pain Assessment   Currently in Pain? No/denies   Multiple Pain Sites No         History  Smoking Status  . Never Smoker  Smokeless Tobacco  . Never Used    Goals Met:  Independence with exercise equipment Exercise tolerated well No report of cardiac concerns or symptoms Strength training completed today  Goals Unmet:  Not Applicable  Comments: Pt able to follow exercise prescription today without complaint.  Will continue to monitor for progression.    Dr. Emily Filbert is Medical Director for [place] and [facility].

## 2017-06-20 ENCOUNTER — Encounter: Payer: Medicare Other | Admitting: *Deleted

## 2017-06-20 DIAGNOSIS — Z955 Presence of coronary angioplasty implant and graft: Secondary | ICD-10-CM | POA: Diagnosis not present

## 2017-06-20 NOTE — Progress Notes (Signed)
Daily Session Note  Patient Details  Name: DEAMONTE SAYEGH MRN: [id number] Date of Birth: [birth year] Referring Provider:     Cardiac Rehab from 05/23/2017 in [facility] Cardiac and Pulmonary Rehab  Referring Provider  Isaias Cowman MD      Encounter Date: 06/20/2017  Check In:     Session Check In - 06/20/17 0755      Check-In   Location [facility]-Cardiac & Pulmonary Rehab   Staff Present Earlean Shawl, BS, ACSM CEP, Exercise Physiologist;Carroll Enterkin, RN, Levie Heritage, MA, ACSM RCEP, Exercise Physiologist   Supervising physician immediately available to respond to emergencies See telemetry face sheet for immediately available ER MD   Medication changes reported     No   Fall or balance concerns reported    No   Warm-up and Cool-down Performed on first and last piece of equipment   Resistance Training Performed Yes   VAD Patient? No     Pain Assessment   Currently in Pain? No/denies   Multiple Pain Sites No         History  Smoking Status  . Never Smoker  Smokeless Tobacco  . Never Used    Goals Met:  Independence with exercise equipment Exercise tolerated well No report of cardiac concerns or symptoms Strength training completed today  Goals Unmet:  Not Applicable  Comments: Pt able to follow exercise prescription today without complaint.  Will continue to monitor for progression.    Dr. Emily Filbert is Medical Director for [place] and [facility].

## 2017-06-22 DIAGNOSIS — Z955 Presence of coronary angioplasty implant and graft: Secondary | ICD-10-CM | POA: Diagnosis not present

## 2017-06-22 NOTE — Progress Notes (Signed)
Daily Session Note  Patient Details  Name: Billy Walker MRN: [id number] Date of Birth: [birth year] Referring Provider:     Cardiac Rehab from 05/23/2017 in [facility] Cardiac and Pulmonary Rehab  Referring Provider  Isaias Cowman MD      Encounter Date: 06/22/2017  Check In:     Session Check In - 06/22/17 0759      Check-In   Location [facility]-Cardiac & Pulmonary Rehab   Staff Present Gerlene Burdock, RN, Levie Heritage, MA, ACSM RCEP, Exercise Physiologist;Eliz Nigg Flavia Shipper   Supervising physician immediately available to respond to emergencies See telemetry face sheet for immediately available ER MD   Medication changes reported     No   Fall or balance concerns reported    No   Warm-up and Cool-down Performed on first and last piece of equipment   Resistance Training Performed Yes   VAD Patient? No     Pain Assessment   Currently in Pain? No/denies         History  Smoking Status  . Never Smoker  Smokeless Tobacco  . Never Used    Goals Met:  Independence with exercise equipment Exercise tolerated well No report of cardiac concerns or symptoms Strength training completed today  Goals Unmet:  Not Applicable  Comments: Pt able to follow exercise prescription today without complaint.  Will continue to monitor for progression.   Dr. Emily Filbert is Medical Director for [place] and [facility].

## 2017-06-24 ENCOUNTER — Encounter: Payer: Medicare Other | Attending: Cardiology

## 2017-06-24 DIAGNOSIS — I10 Essential (primary) hypertension: Secondary | ICD-10-CM | POA: Diagnosis not present

## 2017-06-24 DIAGNOSIS — Z955 Presence of coronary angioplasty implant and graft: Secondary | ICD-10-CM

## 2017-06-24 DIAGNOSIS — Z7982 Long term (current) use of aspirin: Secondary | ICD-10-CM | POA: Diagnosis not present

## 2017-06-24 DIAGNOSIS — Z79899 Other long term (current) drug therapy: Secondary | ICD-10-CM | POA: Diagnosis not present

## 2017-06-24 DIAGNOSIS — Z8546 Personal history of malignant neoplasm of prostate: Secondary | ICD-10-CM | POA: Diagnosis not present

## 2017-06-24 DIAGNOSIS — Z7902 Long term (current) use of antithrombotics/antiplatelets: Secondary | ICD-10-CM | POA: Insufficient documentation

## 2017-06-24 NOTE — Progress Notes (Signed)
Daily Session Note  Patient Details  Name: Billy Walker MRN: [id number] Date of Birth: 01/08/1946 Referring Provider:     Cardiac Rehab from 05/23/2017 in [facility] Cardiac and Pulmonary Rehab  Referring Provider  Isaias Cowman MD      Encounter Date: 06/24/2017  Check In:     Session Check In - 06/24/17 0823      Check-In   Location [facility]-Cardiac & Pulmonary Rehab   Staff Present Heath Lark, RN, BSN, CCRP;Jessica Luan Pulling, MA, ACSM RCEP, Exercise Physiologist;Amanda Oletta Darter, BA, ACSM CEP, Exercise Physiologist   Supervising physician immediately available to respond to emergencies See telemetry face sheet for immediately available ER MD   Medication changes reported     No   Fall or balance concerns reported    No   Warm-up and Cool-down Performed on first and last piece of equipment   Resistance Training Performed Yes   VAD Patient? No     Pain Assessment   Currently in Pain? No/denies   Multiple Pain Sites No         History  Smoking Status  . Never Smoker  Smokeless Tobacco  . Never Used    Goals Met:  Independence with exercise equipment Exercise tolerated well No report of cardiac concerns or symptoms Strength training completed today  Goals Unmet:  Not Applicable  Comments: Pt able to follow exercise prescription today without complaint.  Will continue to monitor for progression.    Dr. Emily Filbert is Medical Director for [place] and [facility].

## 2017-06-27 ENCOUNTER — Encounter: Payer: Medicare Other | Admitting: *Deleted

## 2017-06-27 DIAGNOSIS — Z955 Presence of coronary angioplasty implant and graft: Secondary | ICD-10-CM | POA: Diagnosis not present

## 2017-06-27 NOTE — Progress Notes (Signed)
Daily Session Note  Patient Details  Name: Billy Walker MRN: [id number] Date of Birth: 08-18-1946 Referring Provider:     Cardiac Rehab from 05/23/2017 in [facility] Cardiac and Pulmonary Rehab  Referring Provider  Isaias Cowman MD      Encounter Date: 06/27/2017  Check In: Session Check In - 06/27/17 0745      Check-In   Location  [facility]-Cardiac & Pulmonary Rehab    Staff Present  Darel Hong, RN Moises Blood, BS, ACSM CEP, Exercise Physiologist;Jessica Luan Pulling, [place], ACSM RCEP, Exercise Physiologist    Supervising physician immediately available to respond to emergencies  See telemetry face sheet for immediately available ER MD    Medication changes reported      No    Fall or balance concerns reported     No    Warm-up and Cool-down  Performed on first and last piece of equipment    Resistance Training Performed  Yes    VAD Patient?  No      Pain Assessment   Currently in Pain?  No/denies          Social History   Tobacco Use  Smoking Status Never Smoker  Smokeless Tobacco Never Used    Goals Met:  Independence with exercise equipment Exercise tolerated well No report of cardiac concerns or symptoms Strength training completed today  Goals Unmet:  Not Applicable  Comments: Pt able to follow exercise prescription today without complaint.  Will continue to monitor for progression.    Dr. Emily Filbert is Medical Director for [place] and [facility].

## 2017-06-29 ENCOUNTER — Encounter: Payer: Self-pay | Admitting: *Deleted

## 2017-06-29 DIAGNOSIS — Z955 Presence of coronary angioplasty implant and graft: Secondary | ICD-10-CM | POA: Diagnosis not present

## 2017-06-29 NOTE — Progress Notes (Signed)
Cardiac Individual Treatment Plan  Patient Details  Name: Billy Walker MRN: [id number] Date of Birth: [birth year] Referring Provider:     Cardiac Rehab from 05/23/2017 in [facility] Cardiac and Pulmonary Rehab  Referring Provider  Isaias Cowman MD      Initial Encounter Date:    Cardiac Rehab from 05/23/2017 in [facility] Cardiac and Pulmonary Rehab  Date  05/23/17  Referring Provider  Isaias Cowman MD      Visit Diagnosis: Status post coronary artery stent placement  Patient's Home Medications on Admission:  Current Outpatient Medications:  .  aspirin EC 81 MG tablet, Take 81 mg by mouth daily., Disp: , Rfl:  .  atorvastatin (LIPITOR) 40 MG tablet, Take 1 tablet (40 mg total) by mouth daily at 6 PM., Disp: 30 tablet, Rfl: 0 .  clopidogrel (PLAVIX) 75 MG tablet, Take 75 mg by mouth at bedtime., Disp: , Rfl:  .  enalapril (VASOTEC) 5 MG tablet, Take 1 tablet (5 mg total) by mouth daily., Disp: 30 tablet, Rfl: 0 .  metoprolol tartrate (LOPRESSOR) 25 MG tablet, Take 1 tablet (25 mg total) by mouth 2 (two) times daily., Disp: 60 tablet, Rfl: 0 .  nitroGLYCERIN (NITROSTAT) 0.4 MG SL tablet, Place 1 tablet (0.4 mg total) under the tongue every 5 (five) minutes as needed for chest pain., Disp: 30 tablet, Rfl: 0  Past Medical History: Past Medical History:  Diagnosis Date  . Hypertension   . Prostate cancer ([place])     Tobacco Use: Social History   Tobacco Use  Smoking Status Never Smoker  Smokeless Tobacco Never Used    Labs: Recent Review Scientist, physiological    Labs for ITP Cardiac and Pulmonary Rehab Latest Ref Rng & Units 04/10/2017 04/11/2017   Cholestrol 0 - 200 mg/dL 195 171   LDLCALC 0 - 99 mg/dL 125(H) 115(H)   HDL >40 mg/dL 34(L) 29(L)   Trlycerides <150 mg/dL 182(H) 133       Exercise Target Goals:    Exercise Program Goal: Individual exercise prescription set with THRR, safety & activity barriers. Participant demonstrates ability to understand and report RPE  using BORG scale, to self-measure pulse accurately, and to acknowledge the importance of the exercise prescription.  Exercise Prescription Goal: Starting with aerobic activity 30 plus minutes a day, 3 days per week for initial exercise prescription. Provide home exercise prescription and guidelines that participant acknowledges understanding prior to discharge.  Activity Barriers & Risk Stratification: Activity Barriers & Cardiac Risk Stratification - 05/23/17 1239      Activity Barriers & Cardiac Risk Stratification   Activity Barriers  Joint Problems;Deconditioning;Muscular Weakness occasional left knee pain   occasional left knee pain   Cardiac Risk Stratification  Moderate       6 Minute Walk: 6 Minute Walk    Row Name 05/23/17 1546         6 Minute Walk   Phase  Initial     Distance  1550 feet     Walk Time  6 minutes     # of Rest Breaks  0     MPH  2.94     METS  2.99     RPE  12     Perceived Dyspnea   12     VO2 Peak  10.49     Symptoms  No     Resting HR  55 bpm     Resting BP  122/66     Resting Oxygen Saturation   100 %  Exercise Oxygen Saturation  during 6 min walk  100 %     Max Ex. HR  78 bpm     Max Ex. BP  164/74     2 Minute Post BP  120/66        Oxygen Initial Assessment:   Oxygen Re-Evaluation: Oxygen Re-Evaluation    Row Name 06/02/17 1457             Program Oxygen Prescription   Program Oxygen Prescription  None         Home Oxygen   Home Oxygen Device  None       Home Exercise Oxygen Prescription  None       Home at Rest Exercise Oxygen Prescription  None          Oxygen Discharge (Final Oxygen Re-Evaluation): Oxygen Re-Evaluation - 06/02/17 1457      Program Oxygen Prescription   Program Oxygen Prescription  None      Home Oxygen   Home Oxygen Device  None    Home Exercise Oxygen Prescription  None    Home at Rest Exercise Oxygen Prescription  None       Initial Exercise Prescription: Initial Exercise  Prescription - 05/23/17 1500      Date of Initial Exercise RX and Referring Provider   Date  05/23/17    Referring Provider  Isaias Cowman MD      Treadmill   MPH  2.4    Grade  0.5    Minutes  15    METs  3      Bike   Level  1.1    Watts  32    Minutes  15    METs  2.9      NuStep   Level  3    SPM  80    Minutes  15    METs  3      Prescription Details   Frequency (times per week)  3    Duration  Progress to 45 minutes of aerobic exercise without signs/symptoms of physical distress      Intensity   THRR 40-80% of Max Heartrate  93-130    Ratings of Perceived Exertion  11-13    Perceived Dyspnea  0-4      Progression   Progression  Continue to progress workloads to maintain intensity without signs/symptoms of physical distress.      Resistance Training   Training Prescription  Yes    Weight  4 lbs    Reps  10-15       Perform Capillary Blood Glucose checks as needed.  Exercise Prescription Changes: Exercise Prescription Changes    Row Name 05/23/17 1300 06/01/17 0800 06/08/17 1500 06/22/17 1500       Response to Exercise   Blood Pressure (Admit)  122/66  -  114/70  122/68    Blood Pressure (Exercise)  164/74  -  152/76  134/64    Blood Pressure (Exit)  120/66  -  110/66  124/64    Heart Rate (Admit)  55 bpm  -  63 bpm  75 bpm    Heart Rate (Exercise)  80 bpm  -  119 bpm  116 bpm    Heart Rate (Exit)  66 bpm  -  64 bpm  79 bpm    Oxygen Saturation (Admit)  100 %  -  -  -    Oxygen Saturation (Exercise)  100 %  -  -  -  Rating of Perceived Exertion (Exercise)  12  -  12  13    Symptoms  none  -  none  none    Comments  walk test results  -  -  -    Duration  -  -  Progress to 45 minutes of aerobic exercise without signs/symptoms of physical distress  Continue with 45 min of aerobic exercise without signs/symptoms of physical distress.    Intensity  -  -  THRR unchanged  THRR unchanged      Progression   Progression  -  -  Continue to progress  workloads to maintain intensity without signs/symptoms of physical distress.  Continue to progress workloads to maintain intensity without signs/symptoms of physical distress.    Average METs  -  -  5.86  4.41      Resistance Training   Training Prescription  -  -  Yes  Yes    Weight  -  -  4 lb  4 lb    Reps  -  -  10-15  10-15      Interval Training   Interval Training  -  -  No  No      Treadmill   MPH  -  -  3  3.5    Grade  -  -  1  1.5    Minutes  -  -  15  15    METs  -  -  3.71  4.4      Bike   Level  -  -  -  2    Watts  -  -  -  32    Minutes  -  -  -  15    METs  -  -  -  2.94      NuStep   Level  -  -  5  5    SPM  -  -  80  -    Minutes  -  -  15  15    METs  -  -  5.8  6.6      Home Exercise Plan   Plans to continue exercise at  -  Home (comment) walking at home  Home (comment) walking at home  Home (comment) walking at home    Frequency  -  Add 1 additional day to program exercise sessions.  Add 1 additional day to program exercise sessions.  Add 2 additional days to program exercise sessions.    Initial Home Exercises Provided  -  06/01/17  06/01/17  06/01/17       Exercise Comments: Exercise Comments    Row Name 05/27/17 [phone]           Exercise Comments  First full day of exercise!  Patient was oriented to gym and equipment including functions, settings, policies, and procedures.  Patient's individual exercise prescription and treatment plan were reviewed.  All starting workloads were established based on the results of the 6 minute walk test done at initial orientation visit.  The plan for exercise progression was also introduced and progression will be customized based on patient's performance and goals.          Exercise Goals and Review: Exercise Goals    Row Name 05/23/17 1550             Exercise Goals   Increase Physical Activity  Yes       Intervention  Provide advice, education,  support and counseling about physical activity/exercise  needs.;Develop an individualized exercise prescription for aerobic and resistive training based on initial evaluation findings, risk stratification, comorbidities and participant's personal goals.       Expected Outcomes  Achievement of increased cardiorespiratory fitness and enhanced flexibility, muscular endurance and strength shown through measurements of functional capacity and personal statement of participant.       Increase Strength and Stamina  Yes       Intervention  Provide advice, education, support and counseling about physical activity/exercise needs.;Develop an individualized exercise prescription for aerobic and resistive training based on initial evaluation findings, risk stratification, comorbidities and participant's personal goals.       Expected Outcomes  Achievement of increased cardiorespiratory fitness and enhanced flexibility, muscular endurance and strength shown through measurements of functional capacity and personal statement of participant.       Able to understand and use rate of perceived exertion (RPE) scale  Yes       Intervention  Provide education and explanation on how to use RPE scale       Expected Outcomes  Short Term: Able to use RPE daily in rehab to express subjective intensity level;Long Term:  Able to use RPE to guide intensity level when exercising independently       Knowledge and understanding of Target Heart Rate Range (THRR)  Yes       Intervention  Provide education and explanation of THRR including how the numbers were predicted and where they are located for reference       Expected Outcomes  Short Term: Able to state/look up THRR;Long Term: Able to use THRR to govern intensity when exercising independently;Short Term: Able to use daily as guideline for intensity in rehab       Able to check pulse independently  Yes       Intervention  Provide education and demonstration on how to check pulse in carotid and radial arteries.;Review the importance of  being able to check your own pulse for safety during independent exercise       Expected Outcomes  Short Term: Able to explain why pulse checking is important during independent exercise;Long Term: Able to check pulse independently and accurately       Understanding of Exercise Prescription  Yes       Intervention  Provide education, explanation, and written materials on patient's individual exercise prescription       Expected Outcomes  Short Term: Able to explain program exercise prescription;Long Term: Able to explain home exercise prescription to exercise independently          Exercise Goals Re-Evaluation : Exercise Goals Re-Evaluation    Row Name 05/27/17 [phone] 06/01/17 0816 06/08/17 1536 06/15/17 0747       Exercise Goal Re-Evaluation   Exercise Goals Review  Able to understand and use rate of perceived exertion (RPE) scale;Knowledge and understanding of Target Heart Rate Range (THRR);Understanding of Exercise Prescription  Increase Physical Activity;Increase Strength and Stamina;Able to understand and use rate of perceived exertion (RPE) scale;Able to check pulse independently;Knowledge and understanding of Target Heart Rate Range (THRR);Understanding of Exercise Prescription  Increase Physical Activity;Increase Strength and Stamina  Increase Physical Activity;Increase Strength and Stamina;Understanding of Exercise Prescription    Comments  Reviewed RPE scale, THR and program prescription with pt today.  Pt voiced understanding and was given a copy of goals to take home.   Reviewed home exercise with pt today.  Pt plans to walk 2 extra days a week  for exercise.  Reviewed THR, pulse, RPE, sign and symptoms, NTG use, and when to call 911 or MD.  Also discussed weather considerations and indoor options.  Pt voiced understanding.Billy Walker is progressing well and has added speed and incline to intital exercise intensity.  Billy Walker has been doing well in rehab.  He has trouble getting in his exercise on  off days due to his work schedule.  He is walking on the weekends and he does try to walk after work if time allows.  Billy Walker feels that his strength and stamina was not lacking, but he doesn't feel as tired now as he did before.     Expected Outcomes  Short: Use RPE daily to regulate intensity.  Long: Follow program prescription in THR.  Short: add 2 extra days of walking at home. Long: Exercise independently   [place] will continue to attend and exercise at home.  Long - Billy Walker will maintain fitness gains.  Short: Try to get in more walks after work.  Long: Continue to exercise independently.       Discharge Exercise Prescription (Final Exercise Prescription Changes): Exercise Prescription Changes - 06/22/17 1500      Response to Exercise   Blood Pressure (Admit)  122/68    Blood Pressure (Exercise)  134/64    Blood Pressure (Exit)  124/64    Heart Rate (Admit)  75 bpm    Heart Rate (Exercise)  116 bpm    Heart Rate (Exit)  79 bpm    Rating of Perceived Exertion (Exercise)  13    Symptoms  none    Duration  Continue with 45 min of aerobic exercise without signs/symptoms of physical distress.    Intensity  THRR unchanged      Progression   Progression  Continue to progress workloads to maintain intensity without signs/symptoms of physical distress.    Average METs  4.41      Resistance Training   Training Prescription  Yes    Weight  4 lb    Reps  10-15      Interval Training   Interval Training  No      Treadmill   MPH  3.5    Grade  1.5    Minutes  15    METs  4.4      Bike   Level  2    Watts  32    Minutes  15    METs  2.94      NuStep   Level  5    Minutes  15    METs  6.6      Home Exercise Plan   Plans to continue exercise at  Home (comment) walking at home   walking at home   Frequency  Add 2 additional days to program exercise sessions.    Initial Home Exercises Provided  06/01/17       Nutrition:  Target Goals: Understanding of nutrition  guidelines, daily intake of sodium <159m, cholesterol <2075m calories 30% from fat and 7% or less from saturated fats, daily to have 5 or more servings of fruits and vegetables.  Biometrics: Pre Biometrics - 05/23/17 1551      Pre Biometrics   Height  5' 9.5" (1.765 m)    Weight  222 lb (100.7 kg)    Waist Circumference  43.25 inches    Hip Circumference  40.5 inches    Waist to Hip Ratio  1.07 %    BMI (  Calculated)  32.32    Single Leg Stand  30 seconds        Nutrition Therapy Plan and Nutrition Goals: Nutrition Therapy & Goals - 06/02/17 1457      Nutrition Therapy   RD appointment defered  Yes for now   for now      Nutrition Discharge: Rate Your Plate Scores: Nutrition Assessments - 05/23/17 1233      MEDFICTS Scores   Pre Score  74       Nutrition Goals Re-Evaluation: Nutrition Goals Re-Evaluation    Row Name 06/15/17 0756             Goals   Current Weight  220 lb (99.8 kg)       Nutrition Goal  Meet with dietician to discuss weight loss diet       Comment  Appointment scheduled for 11/12 at 10am       Expected Outcome  Short: Meet with dietician  Long: Heart [facility]al[facility] diet          Nutrition Goals Discharge (Final Nutrition Goals Re-Evaluation): Nutrition Goals Re-Evaluation - 06/15/17 0756      Goals   Current Weight  220 lb (99.8 kg)    Nutrition Goal  Meet with dietician to discuss weight loss diet    Comment  Appointment scheduled for 11/12 at 10am    Expected Outcome  Short: Meet with dietician  Long: Heart [facility]al[facility] diet       Psychosocial: Target Goals: Acknowledge presence or absence of significant depression and/or stress, maximize coping skills, provide positive support system. Participant is able to verbalize types and ability to use techniques and skills needed for reducing stress and depression.   Initial Review & Psychosocial Screening: Initial Psych Review & Screening - 05/23/17 1234      Initial Review   Current issues with   Current Stress Concerns    Source of Stress Concerns  Unable to participate in former interests or hobbies;Unable to perform yard/household activities      [place]?  Yes      Screening Interventions   Interventions  Yes;Encouraged to exercise;Program counselor consult    Expected Outcomes  Short Term goal: Utilizing psychosocial counselor, staff and physician to assist with identification of specific Stressors or current issues interfering with healing process. Setting desired goal for each stressor or current issue identified.;Long Term Goal: Stressors or current issues are controlled or eliminated.;Short Term goal: Identification and review with participant of any Quality of Life or Depression concerns found by scoring the questionnaire.;Long Term goal: The participant improves quality of Life and PHQ9 Scores as seen by post scores and/or verbalization of changes       Quality of Life Scores:  Quality of Life - 05/23/17 1235      Quality of Life Scores   Health/Function Pre  26.46 %    Socioeconomic Pre  28 %    Psych/Spiritual Pre  23.79 %    Family Pre  30 %    GLOBAL Pre  26.28 %       PHQ-9: Recent Review Flowsheet Data    Depression screen [facility] 2/9 05/23/2017   Decreased Interest 0   Down, Depressed, Hopeless 0   PHQ - 2 Score 0   Altered sleeping 0   Tired, decreased energy 0   Change in appetite 0   Feeling bad or failure about yourself  0   Trouble concentrating 0   Moving  slowly or fidgety/restless 0   Suicidal thoughts 0   PHQ-9 Score 0   Difficult doing work/chores Not difficult at all     Interpretation of Total Score  Total Score Depression Severity:  1-4 = Minimal depression, 5-9 = Mild depression, 10-14 = Moderate depression, 15-19 = Moderately severe depression, 20-27 = Severe depression   Psychosocial Evaluation and Intervention: Psychosocial Evaluation - 05/30/17 0934      Psychosocial Evaluation & Interventions    Interventions  Encouraged to exercise with the program and follow exercise prescription;Stress management education;Relaxation education    Comments  Counselor met with Mr. Walker [phone]) today for initial psychosocial evaluation.  He is a 71 year old who had a heart attack and (2) stents inserted on 04/08/17.  Billy lives alone but has relatives, friends and neighbors close by who are his primary support system.  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  Billy reports sleeping well and having a good appetite.  He denies a history of depression or anxiety or any current symptoms.  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  He has goals to lose weight; feel better; be heart healthier and feel more comfortable working out without fear of another heart attack.  Staff will follow with Billy throughout the course of this program.      Expected Outcomes  Billy will benefit from consistent exercise to achieve his stated goals.  He will meet with the dietician to address his weight loss goals.  Also, the educational and psychoeducational components of this program will be helpful to learn more about his condition and ways to manage and cope more positively.  Staff will follow with Billy.       Psychosocial Re-Evaluation: Psychosocial Re-Evaluation    Row Name 06/15/17 [phone]             Psychosocial Re-Evaluation   Current issues with  Current Stress Concerns       Comments  Billy Walker is feeling pretty good and enjoys coming to rehab. Work continues to be his biggest stressor.  He has been able to return to some of his former activities just not lifting yet.  He is also feeling a little gun shy to really go at it.  We talked aout how intervals can help boost this confidence as well.  He is still sleeping well.       Expected Outcomes  Short: Add in intervals and return to yard work.   Long: Continue to maintain postive attitude!       Interventions  Encouraged to attend Cardiac Rehabilitation for the exercise       Continue Psychosocial Services   Follow up required by staff         Initial Review   Source of Stress Concerns  Unable to perform yard/household activities;Chronic Illness;Occupation          Psychosocial Discharge (Final Psychosocial Re-Evaluation): Psychosocial Re-Evaluation - 06/15/17 0757      Psychosocial Re-Evaluation   Current issues with  Current Stress Concerns    Comments  Billy Walker is feeling pretty good and enjoys coming to rehab. Work continues to be his biggest stressor.  He has been able to return to some of his former activities just not lifting yet.  He is also feeling a little gun shy to really go at it.  We talked  aout how intervals can help boost this confidence as well.  He is still sleeping well.    Expected Outcomes  Short: Add in intervals and return to yard work.  Long: Continue to maintain postive attitude!    Interventions  Encouraged to attend Cardiac Rehabilitation for the exercise    Continue Psychosocial Services   Follow up required by staff      Initial Review   Source of Stress Concerns  Unable to perform yard/household activities;Chronic Illness;Occupation       Vocational Rehabilitation: Provide vocational rehab assistance to qualifying candidates.   Vocational Rehab Evaluation & Intervention: Vocational Rehab - 05/23/17 1238      Initial Vocational Rehab Evaluation & Intervention   Assessment shows need for Vocational Rehabilitation  No       Education: Education Goals: Education classes will be provided on a variety of topics geared toward better understanding of heart health and risk factor modification. Participant will state understanding/return demonstration of topics presented as noted by education test scores.  Learning Barriers/Preferences: Learning Barriers/Preferences - 05/23/17 1237      Learning  Barriers/Preferences   Learning Barriers  None    Learning Preferences  None       Education Topics: General Nutrition Guidelines/Fats and Fiber: -Group instruction provided by verbal, written material, models and posters to present the general guidelines for heart healthy nutrition. Gives an explanation and review of dietary fats and fiber.   Cardiac Rehab from 06/27/2017 in [facility] Cardiac and Pulmonary Rehab  Date  06/06/17  Educator  CR  Instruction Review Code  1- Verbalizes Understanding      Controlling Sodium/Reading Food Labels: -Group verbal and written material supporting the discussion of sodium use in heart healthy nutrition. Review and explanation with models, verbal and written materials for utilization of the food label.   Cardiac Rehab from 06/27/2017 in [facility] Cardiac and Pulmonary Rehab  Date  06/13/17  Educator  CR  Instruction Review Code  1- Verbalizes Understanding      Exercise Physiology & Risk Factors: - Group verbal and written instruction with models to review the exercise physiology of the cardiovascular system and associated critical values. Details cardiovascular disease risk factors and the goals associated with each risk factor.   Cardiac Rehab from 06/27/2017 in [facility] Cardiac and Pulmonary Rehab  Date  06/20/17  Educator  [facility]  Instruction Review Code  1- Verbalizes Understanding      Aerobic Exercise & Resistance Training: - Gives group verbal and written discussion on the health impact of inactivity. On the components of aerobic and resistive training programs and the benefits of this training and how to safely progress through these programs.   Cardiac Rehab from 06/27/2017 in [facility] Cardiac and Pulmonary Rehab  Date  06/22/17  Educator  [facility]  Instruction Review Code  1- Verbalizes Understanding      Flexibility, Balance, General Exercise Guidelines: - Provides group verbal and written instruction on the benefits of flexibility and balance training  programs. Provides general exercise guidelines with specific guidelines to those with heart or lung disease. Demonstration and skill practice provided.   Cardiac Rehab from 06/27/2017 in [facility] Cardiac and Pulmonary Rehab  Date  06/27/17  Educator  [facility]  Instruction Review Code  1- Verbalizes Understanding      Stress Management: - Provides group verbal and written instruction about the health risks of elevated stress, cause of high stress, and healthy ways to reduce stress.   Depression: - Provides group verbal  and written instruction on the correlation between heart/lung disease and depressed mood, treatment options, and the stigmas associated with seeking treatment.   Cardiac Rehab from 06/27/2017 in [facility] Cardiac and Pulmonary Rehab  Date  06/08/17  Educator  [facility]  Instruction Review Code  1- Verbalizes Understanding      Anatomy & Physiology of the Heart: - Group verbal and written instruction and models provide basic cardiac anatomy and physiology, with the coronary electrical and arterial systems. Review of: AMI, Angina, Valve disease, Heart Failure, Cardiac Arrhythmia, Pacemakers, and the ICD.   Cardiac Procedures: - Group verbal and written instruction to review commonly prescribed medications for heart disease. Reviews the medication, class of the drug, and side effects. Includes the steps to properly store meds and maintain the prescription regimen. (beta blockers and nitrates)   Cardiac Medications I: - Group verbal and written instruction to review commonly prescribed medications for heart disease. Reviews the medication, class of the drug, and side effects. Includes the steps to properly store meds and maintain the prescription regimen.   Cardiac Medications II: -Group verbal and written instruction to review commonly prescribed medications for heart disease. Reviews the medication, class of the drug, and side effects. (all other drug classes)    Go Sex-Intimacy & Heart  Disease, Get SMART - Goal Setting: - Group verbal and written instruction through game format to discuss heart disease and the return to sexual intimacy. Provides group verbal and written material to discuss and apply goal setting through the application of the S.M.A.R.T. Method.   Other Matters of the Heart: - Provides group verbal, written materials and models to describe Heart Failure, Angina, Valve Disease, Peripheral Artery Disease, and Diabetes in the realm of heart disease. Includes description of the disease process and treatment options available to the cardiac patient.   Exercise & Equipment Safety: - Individual verbal instruction and demonstration of equipment use and safety with use of the equipment.   Cardiac Rehab from 06/27/2017 in [facility] Cardiac and Pulmonary Rehab  Date  05/23/17  Educator  [facility]  Instruction Review Code  1- Verbalizes Understanding      Infection Prevention: - Provides verbal and written material to individual with discussion of infection control including proper hand washing and proper equipment cleaning during exercise session.   Cardiac Rehab from 06/27/2017 in [facility] Cardiac and Pulmonary Rehab  Date  05/23/17  Educator  [facility]  Instruction Review Code  1- Verbalizes Understanding      Falls Prevention: - Provides verbal and written material to individual with discussion of falls prevention and safety.   Cardiac Rehab from 06/27/2017 in [facility] Cardiac and Pulmonary Rehab  Date  05/23/17  Educator  [facility]  Instruction Review Code  1- Verbalizes Understanding      Diabetes: - Individual verbal and written instruction to review signs/symptoms of diabetes, desired ranges of glucose level fasting, after meals and with exercise. Acknowledge that pre and post exercise glucose checks will be done for 3 sessions at entry of program.   Other: -Provides group and verbal instruction on various topics (see comments)    Knowledge Questionnaire Score: Knowledge  Questionnaire Score - 05/23/17 1237      Knowledge Questionnaire Score   Pre Score  20/28 Correct answers reviewed with Billy Walker   Correct answers reviewed with Billy Walker      Core Components/Risk Factors/Patient Goals at Admission: Personal Goals and Risk Factors at Admission - 05/23/17 1229      Core Components/Risk Factors/Patient Goals on Admission  Weight Management  Yes;Obesity    Intervention  Weight Management: Develop a combined nutrition and exercise program designed to reach desired caloric intake, while maintaining appropriate intake of nutrient and fiber, sodium and fats, and appropriate energy expenditure required for the weight goal.;Weight Management: Provide education and appropriate resources to help participant work on and attain dietary goals.;Weight Management/Obesity: Establish reasonable short term and long term weight goals.;Obesity: Provide education and appropriate resources to help participant work on and attain dietary goals.    Admit Weight  222 lb (100.7 kg)    Goal Weight: Short Term  217 lb (98.4 kg)    Goal Weight: Long Term  180 lb (81.6 kg)    Expected Outcomes  Short Term: Continue to assess and modify interventions until short term weight is achieved;Long Term: Adherence to nutrition and physical activity/exercise program aimed toward attainment of established weight goal;Weight Loss: Understanding of general recommendations for a balanced deficit meal plan, which promotes 1-2 lb weight loss per week and includes a negative energy balance of [phone] kcal/d;Understanding recommendations for meals to include 15-35% energy as protein, 25-35% energy from fat, 35-60% energy from carbohydrates, less than 274m of dietary cholesterol, 20-35 gm of total fiber daily;Understanding of distribution of calorie intake throughout the day with the consumption of 4-5 meals/snacks    Hypertension  Yes    Intervention  Provide education on lifestyle modifcations including regular  physical activity/exercise, weight management, moderate sodium restriction and increased consumption of fresh fruit, vegetables, and low fat dairy, alcohol moderation, and smoking cessation.;Monitor prescription use compliance.    Expected Outcomes  Short Term: Continued assessment and intervention until BP is < 140/988mHG in hypertensive participants. < 130/8032mG in hypertensive participants with diabetes, heart failure or chronic kidney disease.;Long Term: Maintenance of blood pressure at goal levels.    Lipids  Yes    Intervention  Provide education and support for participant on nutrition & aerobic/resistive exercise along with prescribed medications to achieve LDL <22m29mDL >40mg19m Expected Outcomes  Short Term: Participant states understanding of desired cholesterol values and is compliant with medications prescribed. Participant is following exercise prescription and nutrition guidelines.;Long Term: Cholesterol controlled with medications as prescribed, with individualized exercise RX and with personalized nutrition plan. Value goals: LDL < 22mg,80m > 40 mg.    Personal Goal Other  Yes    Personal Goal  ThomasIrby like to learn more about cardiac education because this event came as a surprise.     Intervention  During [facility], education will be provided about heart disease and other components of the program    Expected Outcomes  Short Term: ThomasJamareeattent [facility]; Long Term: ThomasXzayvionapply lifestyle changes that [facility] has [facility]       Core Components/Risk Factors/Patient Goals Review:  Goals and Risk Factor Review    Row Name 06/15/17 0749             Core Components/Risk Factors/Patient Goals Review   Personal Goals Review  Weight Management/Obesity;Hypertension;Lipids;Other       Review  Billy Billy Doloreseen doing well in rehab.  He has lost about 5-6 lbs, but has stalled out some.  He is down to 215 lbs at home.  He  has reduced his eating and portion control. We talked about adding in intervals to help with weight loss.  Billy checks his blood pressures at home and keeps a record.  He  has been getting higher numbers at home than we are here and he is going to bring in his machine to check against ours.  He feels that his medications are working for him.  He has learned a lot from education classes.  He continues to struggle with diet and we will make him an appointment.        Expected Outcomes  Short: Add in intervals and meet with dietician for weight loss.  Long: Continue to work on risk factor modifications.           Core Components/Risk Factors/Patient Goals at Discharge (Final Review):  Goals and Risk Factor Review - 06/15/17 0749      Core Components/Risk Factors/Patient Goals Review   Personal Goals Review  Weight Management/Obesity;Hypertension;Lipids;Other    Review  Billy Walker has been doing well in rehab.  He has lost about 5-6 lbs, but has stalled out some.  He is down to 215 lbs at home.  He has reduced his eating and portion control. We talked about adding in intervals to help with weight loss.  Billy checks his blood pressures at home and keeps a record.  He has been getting higher numbers at home than we are here and he is going to bring in his machine to check against ours.  He feels that his medications are working for him.  He has learned a lot from education classes.  He continues to struggle with diet and we will make him an appointment.     Expected Outcomes  Short: Add in intervals and meet with dietician for weight loss.  Long: Continue to work on risk factor modifications.        ITP Comments: ITP Comments    Row Name 05/23/17 1223 06/01/17 0624 06/29/17 0625       ITP Comments  Med Review Completed. Initial ITP created. Diagnosis can be found in [facility] 05/04/17  30 day Review. Continue with ITP unless directed changes per Medical Director Review.   30 day review. Continue with ITP unless  directed changes per Medical Director review.         Comments:

## 2017-06-29 NOTE — Progress Notes (Signed)
Daily Session Note  Patient Details  Name: Billy Walker MRN: [id number] Date of Birth: 01/22/1946 Referring Provider:     Cardiac Rehab from 05/23/2017 in [facility] Cardiac and Pulmonary Rehab  Referring Provider  Paraschos, Alexander MD      Encounter Date: 06/29/2017  Check In: Session Check In - 06/29/17 0727      Check-In   Location  [facility]-Cardiac & Pulmonary Rehab    Staff Present  Joseph Hood RCP,RRT,BSRT;Susanne Bice, RN, BSN, CCRP;Jessica Hawkins, MA, ACSM RCEP, Exercise Physiologist    Supervising physician immediately available to respond to emergencies  See telemetry face sheet for immediately available ER MD    Medication changes reported      No    Fall or balance concerns reported     No    Warm-up and Cool-down  Performed on first and last piece of equipment    Resistance Training Performed  Yes    VAD Patient?  No      Pain Assessment   Currently in Pain?  No/denies          Social History   Tobacco Use  Smoking Status Never Smoker  Smokeless Tobacco Never Used    Goals Met:  Independence with exercise equipment Exercise tolerated well No report of cardiac concerns or symptoms Strength training completed today  Goals Unmet:  Not Applicable  Comments: Pt able to follow exercise prescription today without complaint.  Will continue to monitor for progression.   Dr. Mark Miller is Medical Director for [facility] and [facility]. 

## 2017-07-04 ENCOUNTER — Encounter: Payer: Medicare Other | Admitting: *Deleted

## 2017-07-04 DIAGNOSIS — Z955 Presence of coronary angioplasty implant and graft: Secondary | ICD-10-CM | POA: Diagnosis not present

## 2017-07-04 NOTE — Progress Notes (Signed)
Daily Session Note  Patient Details  Name: Billy Walker MRN: [id number] Date of Birth: November 14, 1945 Referring Provider:     Cardiac Rehab from 05/23/2017 in [facility] Cardiac and Pulmonary Rehab  Referring Provider  Isaias Cowman MD      Encounter Date: 07/04/2017  Check In: Session Check In - 07/04/17 0754      Check-In   Location  [facility]-Cardiac & Pulmonary Rehab    Staff Present  Earlean Shawl, BS, ACSM CEP, Exercise Physiologist;Carroll Enterkin, RN, Levie Heritage, MA, ACSM RCEP, Exercise Physiologist    Supervising physician immediately available to respond to emergencies  See telemetry face sheet for immediately available ER MD    Medication changes reported      No    Fall or balance concerns reported     No    Warm-up and Cool-down  Performed on first and last piece of equipment    Resistance Training Performed  Yes    VAD Patient?  No      Pain Assessment   Currently in Pain?  No/denies          Social History   Tobacco Use  Smoking Status Never Smoker  Smokeless Tobacco Never Used    Goals Met:  Independence with exercise equipment Exercise tolerated well No report of cardiac concerns or symptoms Strength training completed today  Goals Unmet:  Not Applicable  Comments: Pt able to follow exercise prescription today without complaint.  Will continue to monitor for progression.    Dr. Emily Filbert is Medical Director for [place] and [facility].

## 2017-07-06 DIAGNOSIS — Z955 Presence of coronary angioplasty implant and graft: Secondary | ICD-10-CM

## 2017-07-06 NOTE — Progress Notes (Signed)
Daily Session Note  Patient Details  Name: Billy Walker MRN: [id number] Date of Birth: 1946-05-16 Referring Provider:     Cardiac Rehab from 05/23/2017 in [facility] Cardiac and Pulmonary Rehab  Referring Provider  Isaias Cowman MD      Encounter Date: 07/06/2017  Check In: Session Check In - 07/06/17 0741      Check-In   Location  [facility]-Cardiac & Pulmonary Rehab    Staff Present  Justin Mend Lorre Nick, MA, ACSM RCEP, Exercise Physiologist;Krista Frederico Hamman, RN BSN    Supervising physician immediately available to respond to emergencies  See telemetry face sheet for immediately available ER MD    Medication changes reported      No    Fall or balance concerns reported     No    Warm-up and Cool-down  Performed on first and last piece of equipment    Resistance Training Performed  Yes    VAD Patient?  No      Pain Assessment   Currently in Pain?  No/denies        Exercise Prescription Changes - 07/05/17 1400      Response to Exercise   Blood Pressure (Admit)  110/60    Blood Pressure (Exercise)  130/60    Blood Pressure (Exit)  116/60    Heart Rate (Admit)  66 bpm    Heart Rate (Exercise)  111 bpm    Heart Rate (Exit)  77 bpm    Rating of Perceived Exertion (Exercise)  12    Symptoms  none    Duration  Continue with 45 min of aerobic exercise without signs/symptoms of physical distress.    Intensity  THRR unchanged      Progression   Progression  Continue to progress workloads to maintain intensity without signs/symptoms of physical distress.    Average METs  5.28      Resistance Training   Training Prescription  Yes    Weight  4 lb    Reps  10-15      Interval Training   Interval Training  No      Treadmill   MPH  3.5    Grade  15    Minutes  15    METs  4.4      Bike   Level  2    Watts  90    Minutes  15    METs  4.83      NuStep   Level  6    Minutes  15    METs  6.6      Home Exercise Plan   Plans to continue exercise at   Home (comment) walking at home    Frequency  Add 2 additional days to program exercise sessions.    Initial Home Exercises Provided  06/01/17       Social History   Tobacco Use  Smoking Status Never Smoker  Smokeless Tobacco Never Used    Goals Met:  Independence with exercise equipment Exercise tolerated well No report of cardiac concerns or symptoms Strength training completed today  Goals Unmet:  Not Applicable  Comments: Pt able to follow exercise prescription today without complaint.  Will continue to monitor for progression.   Dr. Emily Filbert is Medical Director for [place] and [facility].

## 2017-07-08 DIAGNOSIS — Z955 Presence of coronary angioplasty implant and graft: Secondary | ICD-10-CM | POA: Diagnosis not present

## 2017-07-08 NOTE — Progress Notes (Signed)
Daily Session Note  Patient Details  Name: Billy Walker MRN: [id number] Date of Birth: [birth year] Referring Provider:     Cardiac Rehab from 05/23/2017 in [facility] Cardiac and Pulmonary Rehab  Referring Provider  Isaias Cowman MD      Encounter Date: 07/08/2017  Check In: Session Check In - 07/08/17 0847      Check-In   Location  [facility]-Cardiac & Pulmonary Rehab    Staff Present  Nada Maclachlan, BA, ACSM CEP, Exercise Physiologist;Jessica Luan Pulling, MA, ACSM RCEP, Exercise Physiologist;Carroll Enterkin, RN, BSN    Supervising physician immediately available to respond to emergencies  See telemetry face sheet for immediately available ER MD    Medication changes reported      No    Fall or balance concerns reported     No    Warm-up and Cool-down  Performed on first and last piece of equipment    Resistance Training Performed  Yes    VAD Patient?  No      Pain Assessment   Currently in Pain?  No/denies          Social History   Tobacco Use  Smoking Status Never Smoker  Smokeless Tobacco Never Used    Goals Met:  Independence with exercise equipment Exercise tolerated well No report of cardiac concerns or symptoms Strength training completed today  Goals Unmet:  Not Applicable  Comments: Pt able to follow exercise prescription today without complaint.  Will continue to monitor for progression.   Dr. Emily Filbert is Medical Director for [place] and [facility].

## 2017-07-11 ENCOUNTER — Encounter: Payer: Medicare Other | Admitting: *Deleted

## 2017-07-11 DIAGNOSIS — Z955 Presence of coronary angioplasty implant and graft: Secondary | ICD-10-CM | POA: Diagnosis not present

## 2017-07-11 NOTE — Progress Notes (Signed)
Daily Session Note  Patient Details  Name: Billy Walker MRN: [id number] Date of Birth: Jan 10, 1946 Referring Provider:     Cardiac Rehab from 05/23/2017 in [facility] Cardiac and Pulmonary Rehab  Referring Provider  Isaias Cowman MD      Encounter Date: 07/11/2017  Check In: Session Check In - 07/11/17 1000      Check-In   Location  [facility]-Cardiac & Pulmonary Rehab    Staff Present  Earlean Shawl, BS, ACSM CEP, Exercise Physiologist;Carroll Enterkin, RN, Levie Heritage, MA, ACSM RCEP, Exercise Physiologist    Supervising physician immediately available to respond to emergencies  See telemetry face sheet for immediately available ER MD    Medication changes reported      No    Fall or balance concerns reported     No    Warm-up and Cool-down  Performed on first and last piece of equipment    Resistance Training Performed  Yes      Pain Assessment   Currently in Pain?  No/denies          Social History   Tobacco Use  Smoking Status Never Smoker  Smokeless Tobacco Never Used    Goals Met:  Independence with exercise equipment Exercise tolerated well No report of cardiac concerns or symptoms Strength training completed today  Goals Unmet:  Not Applicable  Comments: Pt able to follow exercise prescription today without complaint.  Will continue to monitor for progression.    Dr. Emily Filbert is Medical Director for [place] and [facility].

## 2017-07-13 DIAGNOSIS — Z955 Presence of coronary angioplasty implant and graft: Secondary | ICD-10-CM | POA: Diagnosis not present

## 2017-07-13 NOTE — Progress Notes (Signed)
Daily Session Note  Patient Details  Name: Billy Walker MRN: [id number] Date of Birth: [birth year] Referring Provider:     Cardiac Rehab from 05/23/2017 in [facility] Cardiac and Pulmonary Rehab  Referring Provider  Isaias Cowman MD      Encounter Date: 07/13/2017  Check In: Session Check In - 07/13/17 0734      Check-In   Location  [facility]-Cardiac & Pulmonary Rehab    Staff Present  Justin Mend RCP,RRT,BSRT;Heath Lark, RN, BSN, CCRP;Jessica Luan Pulling, MA, ACSM RCEP, Exercise Physiologist    Supervising physician immediately available to respond to emergencies  See telemetry face sheet for immediately available ER MD    Medication changes reported      No    Fall or balance concerns reported     No    Warm-up and Cool-down  Performed on first and last piece of equipment    Resistance Training Performed  Yes    VAD Patient?  No      Pain Assessment   Currently in Pain?  No/denies          Social History   Tobacco Use  Smoking Status Never Smoker  Smokeless Tobacco Never Used    Goals Met:  Independence with exercise equipment Exercise tolerated well No report of cardiac concerns or symptoms Strength training completed today  Goals Unmet:  Not Applicable  Comments: Pt able to follow exercise prescription today without complaint.  Will continue to monitor for progression.  [place] Name 05/23/17 1546 07/13/17 0935       6 Minute Walk   Phase  Initial  Discharge    Distance  1550 feet  1975 feet    Distance % Change  -  27.4 %    Distance Feet Change  -  425 ft    Walk Time  6 minutes  6 minutes    # of Rest Breaks  0  0    MPH  2.94  3.74    METS  2.99  4.08    RPE  12  13    Perceived Dyspnea   12  -    VO2 Peak  10.49  14.19    Symptoms  No  No    Resting HR  55 bpm  60 bpm    Resting BP  122/66  122/66    Resting Oxygen Saturation   100 %  -    Exercise Oxygen Saturation  during 6 min walk  100 %  -    Max Ex. HR  78 bpm  111 bpm    Max Ex. BP  164/74  146/64    2 Minute Post BP  120/66  -        Dr. Emily Filbert is Medical Director for [place] and [facility].

## 2017-07-18 ENCOUNTER — Encounter: Payer: Medicare Other | Admitting: *Deleted

## 2017-07-18 DIAGNOSIS — Z955 Presence of coronary angioplasty implant and graft: Secondary | ICD-10-CM

## 2017-07-18 NOTE — Progress Notes (Signed)
Daily Session Note  Patient Details  Name: Billy Walker MRN: [id number] Date of Birth: August 30, 1945 Referring Provider:     Cardiac Rehab from 05/23/2017 in [facility] Cardiac and Pulmonary Rehab  Referring Provider  Isaias Cowman MD      Encounter Date: 07/18/2017  Check In: Session Check In - 07/18/17 0802      Check-In   Location  [facility]-Cardiac & Pulmonary Rehab    Staff Present  Earlean Shawl, BS, ACSM CEP, Exercise Physiologist;Carroll Enterkin, RN, BSN;Joseph Darrin Nipper, [place], ACSM RCEP, Exercise Physiologist    Supervising physician immediately available to respond to emergencies  See telemetry face sheet for immediately available ER MD    Medication changes reported      No    Fall or balance concerns reported     No    Warm-up and Cool-down  Performed on first and last piece of equipment    Resistance Training Performed  Yes    VAD Patient?  No      Pain Assessment   Currently in Pain?  No/denies    Multiple Pain Sites  No          Social History   Tobacco Use  Smoking Status Never Smoker  Smokeless Tobacco Never Used    Goals Met:  Independence with exercise equipment Exercise tolerated well No report of cardiac concerns or symptoms Strength training completed today  Goals Unmet:  Not Applicable  Comments: Pt able to follow exercise prescription today without complaint.  Will continue to monitor for progression.    Dr. Emily Filbert is Medical Director for [place] and [facility].

## 2017-07-20 DIAGNOSIS — Z955 Presence of coronary angioplasty implant and graft: Secondary | ICD-10-CM | POA: Diagnosis not present

## 2017-07-20 NOTE — Progress Notes (Signed)
Daily Session Note  Patient Details  Name: Billy Walker MRN: [id number] Date of Birth: [birth year]  Referring Provider:     Cardiac Rehab from 05/23/2017 in [facility] Cardiac and Pulmonary Rehab  Referring Provider  Isaias Cowman MD      Encounter Date: 07/20/2017  Check In: Session Check In - 07/20/17 0807      Check-In   Location  [facility]-Cardiac & Pulmonary Rehab    Staff Present  Justin Mend RCP,RRT,BSRT;Heath Lark, RN, BSN, CCRP;Jessica Luan Pulling, MA, ACSM RCEP, Exercise Physiologist    Supervising physician immediately available to respond to emergencies  See telemetry face sheet for immediately available ER MD    Medication changes reported      No    Fall or balance concerns reported     No    Warm-up and Cool-down  Performed on first and last piece of equipment    Resistance Training Performed  Yes    VAD Patient?  No      Pain Assessment   Currently in Pain?  No/denies          Social History   Tobacco Use  Smoking Status Never Smoker  Smokeless Tobacco Never Used    Goals Met:  Independence with exercise equipment Exercise tolerated well No report of cardiac concerns or symptoms Strength training completed today  Goals Unmet:  Not Applicable  Comments: Pt able to follow exercise prescription today without complaint.  Will continue to monitor for progression.   Dr. Emily Filbert is Medical Director for [place] and [facility].

## 2017-07-20 NOTE — Patient Instructions (Signed)
Discharge Instructions  Patient Details  Name: Billy Walker MRN: [id number] Date of Birth: March 05, 1946 Referring Provider:  Isaias Cowman, MD   Number of Visits: 36/36  Reason for Discharge:  Patient reached a stable level of exercise. Patient independent in their exercise. Patient has met program and personal goals.  Smoking History:  Social History   Tobacco Use  Smoking Status Never Smoker  Smokeless Tobacco Never Used    Diagnosis:  Status post coronary artery stent placement  Initial Exercise Prescription: Initial Exercise Prescription - 05/23/17 1500      Date of Initial Exercise RX and Referring Provider   Date  05/23/17    Referring Provider  Isaias Cowman MD      Treadmill   MPH  2.4    Grade  0.5    Minutes  15    METs  3      Bike   Level  1.1    Watts  32    Minutes  15    METs  2.9      NuStep   Level  3    SPM  80    Minutes  15    METs  3      Prescription Details   Frequency (times per week)  3    Duration  Progress to 45 minutes of aerobic exercise without signs/symptoms of physical distress      Intensity   THRR 40-80% of Max Heartrate  93-130    Ratings of Perceived Exertion  11-13    Perceived Dyspnea  0-4      Progression   Progression  Continue to progress workloads to maintain intensity without signs/symptoms of physical distress.      Resistance Training   Training Prescription  Yes    Weight  4 lbs    Reps  10-15       Discharge Exercise Prescription (Final Exercise Prescription Changes): Exercise Prescription Changes - 07/05/17 1400      Response to Exercise   Blood Pressure (Admit)  110/60    Blood Pressure (Exercise)  130/60    Blood Pressure (Exit)  116/60    Heart Rate (Admit)  66 bpm    Heart Rate (Exercise)  111 bpm    Heart Rate (Exit)  77 bpm    Rating of Perceived Exertion (Exercise)  12    Symptoms  none    Duration  Continue with 45 min of aerobic exercise without signs/symptoms of  physical distress.    Intensity  THRR unchanged      Progression   Progression  Continue to progress workloads to maintain intensity without signs/symptoms of physical distress.    Average METs  5.28      Resistance Training   Training Prescription  Yes    Weight  4 lb    Reps  10-15      Interval Training   Interval Training  No      Treadmill   MPH  3.5    Grade  15    Minutes  15    METs  4.4      Bike   Level  2    Watts  90    Minutes  15    METs  4.83      NuStep   Level  6    Minutes  15    METs  6.6      Home Exercise Plan   Plans to continue exercise at  Home (comment) walking at home    Frequency  Add 2 additional days to program exercise sessions.    Initial Home Exercises Provided  06/01/17       Functional Capacity: 6 Minute Walk    Row Name 05/23/17 1546 07/13/17 0935       6 Minute Walk   Phase  Initial  Discharge    Distance  1550 feet  1975 feet    Distance % Change  -  27.4 %    Distance Feet Change  -  425 ft    Walk Time  6 minutes  6 minutes    # of Rest Breaks  0  0    MPH  2.94  3.74    METS  2.99  4.08    RPE  12  13    Perceived Dyspnea   12  -    VO2 Peak  10.49  14.19    Symptoms  No  No    Resting HR  55 bpm  60 bpm    Resting BP  122/66  122/66    Resting Oxygen Saturation   100 %  -    Exercise Oxygen Saturation  during 6 min walk  100 %  -    Max Ex. HR  78 bpm  111 bpm    Max Ex. BP  164/74  146/64    2 Minute Post BP  120/66  -       Quality of Life: Quality of Life - 07/11/17 1006      Quality of Life Scores   Health/Function Pre  26.46 %    Health/Function Post  24.77 %    Health/Function % Change  -6.39 %    Socioeconomic Pre  28 %    Socioeconomic Post  25.07 %    Socioeconomic % Change   -10.46 %    Psych/Spiritual Pre  23.79 %    Psych/Spiritual Post  23.57 %    Psych/Spiritual % Change  -0.92 %    Family Pre  30 %    Family Post  27 %    Family % Change  -10 %    GLOBAL Pre  26.28 %    GLOBAL  Post  24.71 %    GLOBAL % Change  -5.97 %       Personal Goals: Goals established at orientation with interventions provided to work toward goal. Personal Goals and Risk Factors at Admission - 05/23/17 1229      Core Components/Risk Factors/Patient Goals on Admission    Weight Management  Yes;Obesity    Intervention  Weight Management: Develop a combined nutrition and exercise program designed to reach desired caloric intake, while maintaining appropriate intake of nutrient and fiber, sodium and fats, and appropriate energy expenditure required for the weight goal.;Weight Management: Provide education and appropriate resources to help participant work on and attain dietary goals.;Weight Management/Obesity: Establish reasonable short term and long term weight goals.;Obesity: Provide education and appropriate resources to help participant work on and attain dietary goals.    Admit Weight  222 lb (100.7 kg)    Goal Weight: Short Term  217 lb (98.4 kg)    Goal Weight: Long Term  180 lb (81.6 kg)    Expected Outcomes  Short Term: Continue to assess and modify interventions until short term weight is achieved;Long Term: Adherence to nutrition and physical activity/exercise program aimed toward attainment of established weight goal;Weight Loss: Understanding of general recommendations for  a balanced deficit meal plan, which promotes 1-2 lb weight loss per week and includes a negative energy balance of [phone] kcal/d;Understanding recommendations for meals to include 15-35% energy as protein, 25-35% energy from fat, 35-60% energy from carbohydrates, less than 228m of dietary cholesterol, 20-35 gm of total fiber daily;Understanding of distribution of calorie intake throughout the day with the consumption of 4-5 meals/snacks    Hypertension  Yes    Intervention  Provide education on lifestyle modifcations including regular physical activity/exercise, weight management, moderate sodium restriction and  increased consumption of fresh fruit, vegetables, and low fat dairy, alcohol moderation, and smoking cessation.;Monitor prescription use compliance.    Expected Outcomes  Short Term: Continued assessment and intervention until BP is < 140/922mHG in hypertensive participants. < 130/8038mG in hypertensive participants with diabetes, heart failure or chronic kidney disease.;Long Term: Maintenance of blood pressure at goal levels.    Lipids  Yes    Intervention  Provide education and support for participant on nutrition & aerobic/resistive exercise along with prescribed medications to achieve LDL <67m20mDL >40mg16m Expected Outcomes  Short Term: Participant states understanding of desired cholesterol values and is compliant with medications prescribed. Participant is following exercise prescription and nutrition guidelines.;Long Term: Cholesterol controlled with medications as prescribed, with individualized exercise RX and with personalized nutrition plan. Value goals: LDL < 67mg,23m > 40 mg.    Personal Goal Other  Yes    Personal Goal  ThomasJossue like to learn more about cardiac education because this event came as a surprise.     Intervention  During [facility], education will be provided about heart disease and other components of the program    Expected Outcomes  Short Term: ThomasJerritattent [facility]; Long Term: ThomasDurrelapply lifestyle changes that [facility] of [facility]        Personal Goals Discharge: Goals and Risk Factor Review - 07/18/17 0937      Core Components/Risk Factors/Patient Goals Review   Personal Goals Review  Weight Management/Obesity;Hypertension;Lipids    Review  Tommy Konrad Doloresd that he is steadly losing weight and following dietary guidelines and taking meds to control risk factors.     Expected Outcomes  Short: weight loss goal of 200 lbs. Long: Continue to work on risk factor modification.        Exercise Goals and  Review: Exercise Goals    Row Name 05/23/17 1550             Exercise Goals   Increase Physical Activity  Yes       Intervention  Provide advice, education, support and counseling about physical activity/exercise needs.;Develop an individualized exercise prescription for aerobic and resistive training based on initial evaluation findings, risk stratification, comorbidities and participant's personal goals.       Expected Outcomes  Achievement of increased cardiorespiratory fitness and enhanced flexibility, muscular endurance and strength shown through measurements of functional capacity and personal statement of participant.       Increase Strength and Stamina  Yes       Intervention  Provide advice, education, support and counseling about physical activity/exercise needs.;Develop an individualized exercise prescription for aerobic and resistive training based on initial evaluation findings, risk stratification, comorbidities and participant's personal goals.       Expected Outcomes  Achievement of increased cardiorespiratory fitness and enhanced flexibility, muscular endurance and strength shown through measurements of functional capacity and personal statement of participant.  Able to understand and use rate of perceived exertion (RPE) scale  Yes       Intervention  Provide education and explanation on how to use RPE scale       Expected Outcomes  Short Term: Able to use RPE daily in rehab to express subjective intensity level;Long Term:  Able to use RPE to guide intensity level when exercising independently       Knowledge and understanding of Target Heart Rate Range (THRR)  Yes       Intervention  Provide education and explanation of THRR including how the numbers were predicted and where they are located for reference       Expected Outcomes  Short Term: Able to state/look up THRR;Long Term: Able to use THRR to govern intensity when exercising independently;Short Term: Able to use daily  as guideline for intensity in rehab       Able to check pulse independently  Yes       Intervention  Provide education and demonstration on how to check pulse in carotid and radial arteries.;Review the importance of being able to check your own pulse for safety during independent exercise       Expected Outcomes  Short Term: Able to explain why pulse checking is important during independent exercise;Long Term: Able to check pulse independently and accurately       Understanding of Exercise Prescription  Yes       Intervention  Provide education, explanation, and written materials on patient's individual exercise prescription       Expected Outcomes  Short Term: Able to explain program exercise prescription;Long Term: Able to explain home exercise prescription to exercise independently          Nutrition & Weight - Outcomes: Pre Biometrics - 05/23/17 1551      Pre Biometrics   Height  5' 9.5" (1.765 m)    Weight  222 lb (100.7 kg)    Waist Circumference  43.25 inches    Hip Circumference  40.5 inches    Waist to Hip Ratio  1.07 %    BMI (Calculated)  32.32    Single Leg Stand  30 seconds        Nutrition: Nutrition Therapy & Goals - 07/04/17 1110      Nutrition Therapy   Diet  Instrutcted on a meal plan based on heart healthy principles.    Drug/Food Interactions  Statins/Certain Fruits    Protein (specify units)  8    Fiber  25 grams    Whole Grain Foods  3 servings    Saturated Fats  13 max. grams    Fruits and Vegetables  5 servings/day 8 servings ideal    Sodium  1500 grams      Personal Nutrition Goals   Nutrition Goal  Try "I Can't Believe it's not butter" spray.    Personal Goal #2  Try a variety of Ms. DASH seasonings    Personal Goal #3  Read labels for saturated fat, trans fat and sodium.      Intervention Plan   Intervention  Prescribe, educate and counsel regarding individualized specific dietary modifications aiming towards targeted core components such as  weight, hypertension, lipid management, diabetes, heart failure and other comorbidities.;Nutrition handout(s) given to patient.    Expected Outcomes  Short Term Goal: Understand basic principles of dietary content, such as calories, fat, sodium, cholesterol and nutrients.;Short Term Goal: A plan has been developed with personal nutrition goals set during dietitian appointment.;Long  Term Goal: Adherence to prescribed nutrition plan.       Nutrition Discharge: Nutrition Assessments - 07/11/17 1005      MEDFICTS Scores   Pre Score  74    Post Score  6    Score Difference  -68       Education Questionnaire Score: Knowledge Questionnaire Score - 07/11/17 1005      Knowledge Questionnaire Score   Pre Score  20/28    Post Score  28/28       Goals reviewed with patient; copy given to patient.

## 2017-07-22 DIAGNOSIS — Z955 Presence of coronary angioplasty implant and graft: Secondary | ICD-10-CM

## 2017-07-22 NOTE — Progress Notes (Signed)
Daily Session Note  Patient Details  Name: Billy Walker MRN: [id number] Date of Birth: September 20, 1945 Referring Provider:     Card[facility]  Referring Provider  Isaias Cowman MD      Encounter Date: 07/22/2017  Check In: Session Check In - 07/22/17 1025      Check-In   Location  [facility]    Staff Present  Nada Maclachlan, BA, ACSM CEP, Exercise Physiologist;Carroll Enterkin, RN, Levie Heritage, MA, ACSM RCEP, Exercise Physiologist    Supervising physician immediately available to respond to emergencies  See telemetry face sheet for immediately available ER MD    Medication changes reported      No    Fall or balance concerns reported     No    Warm-up and Cool-down  Performed on first and last piece of equipment    Resistance Training Performed  Yes    VAD Patient?  No      Pain Assessment   Currently in Pain?  No/denies          Social History   Tobacco Use  Smoking Status Never Smoker  Smokeless Tobacco Never Used    Goals Met:  Independence with exercise equipment Exercise tolerated well Personal goals reviewed No report of cardiac concerns or symptoms Strength training completed today  Goals Unmet:  Not Applicable  Comments:  Billy Walker graduated today from cardiac rehab with 36/36 sessions completed.  Details of the patient's exercise prescription and what He needs to do in order to continue the prescription and progress were discussed with patient.  Patient was given a copy of prescription and goals.  Patient verbalized understanding.  Billy Walker plans to continue to exercise by walking at home and joining our [employer] three days a week.    Dr. Emily Filbert is Medical Director for [facility].

## 2017-07-22 NOTE — Progress Notes (Signed)
Discharge Progress Report  Patient Details  Name: Billy Walker MRN: [id number] Date of Birth: 1946-01-03 Referring Provider:     Cardiac Rehab from 05/23/2017 in [facility]  Referring Provider  Isaias Cowman MD       Number of Visits: 36/36  Reason for Discharge:  Patient reached a stable level of exercise. Patient independent in their exercise. Patient has met program and personal goals.  Smoking History:  Social History   Tobacco Use  Smoking Status Never Smoker  Smokeless Tobacco Never Used    Diagnosis:  Status post coronary artery stent placement  ADL [facility]SD:   Initial Exercise Prescription: Initial Exercise Prescription - 05/23/17 1500      Date of Initial Exercise RX and Referring Provider   Date  05/23/17    Referring Provider  Isaias Cowman MD      Treadmill   MPH  2.4    Grade  0.5    Minutes  15    METs  3      Bike   Level  1.1    Watts  32    Minutes  15    METs  2.9      NuStep   Level  3    SPM  80    Minutes  15    METs  3      Prescription Details   Frequency (times per week)  3    Duration  Progress to 45 minutes of aerobic exercise without signs/symptoms of physical distress      Intensity   THRR 40-80% of Max Heartrate  93-130    Ratings of Perceived Exertion  11-13    Perceived Dyspnea  0-4      Progression   Progression  Continue to progress workloads to maintain intensity without signs/symptoms of physical distress.      Resistance Training   Training Prescription  Yes    Weight  4 lbs    Reps  10-15       Discharge Exercise Prescription (Final Exercise Prescription Changes): Exercise Prescription Changes - 07/05/17 1400      Response to Exercise   Blood Pressure (Admit)  110/60    Blood Pressure (Exercise)  130/60    Blood Pressure (Exit)  116/60    Heart Rate (Admit)  66 bpm    Heart Rate (Exercise)  111 bpm    Heart Rate (Exit)  77 bpm    Rating of Perceived Exertion  (Exercise)  12    Symptoms  none    Duration  Continue with 45 min of aerobic exercise without signs/symptoms of physical distress.    Intensity  THRR unchanged      Progression   Progression  Continue to progress workloads to maintain intensity without signs/symptoms of physical distress.    Average METs  5.28      Resistance Training   Training Prescription  Yes    Weight  4 lb    Reps  10-15      Interval Training   Interval Training  No      Treadmill   MPH  3.5    Grade  15    Minutes  15    METs  4.4      Bike   Level  2    Watts  90    Minutes  15    METs  4.83      NuStep   Level  6  Minutes  15    METs  6.6      Home Exercise Plan   Plans to continue exercise at  Home (comment) walking at home    Frequency  Add 2 additional days to program exercise sessions.    Initial Home Exercises Provided  06/01/17       Functional Capacity: 6 Minute Walk    Row Name 05/23/17 1546 07/13/17 0935       6 Minute Walk   Phase  Initial  Discharge    Distance  1550 feet  1975 feet    Distance % Change  -  27.4 %    Distance Feet Change  -  425 ft    Walk Time  6 minutes  6 minutes    # of Rest Breaks  0  0    MPH  2.94  3.74    METS  2.99  4.08    RPE  12  13    Perceived Dyspnea   12  -    VO2 Peak  10.49  14.19    Symptoms  No  No    Resting HR  55 bpm  60 bpm    Resting BP  122/66  122/66    Resting Oxygen Saturation   100 %  -    Exercise Oxygen Saturation  during 6 min walk  100 %  -    Max Ex. HR  78 bpm  111 bpm    Max Ex. BP  164/74  146/64    2 Minute Post BP  120/66  -       Psychological, QOL, Others - Outcomes: PHQ 2/9: Depression screen [facility] 2/9 07/11/2017 05/23/2017  Decreased Interest 0 0  Down, Depressed, Hopeless 1 0  PHQ - 2 Score 1 0  Altered sleeping 0 0  Tired, decreased energy 0 0  Change in appetite 0 0  Feeling bad or failure about yourself  0 0  Trouble concentrating 0 0  Moving slowly or fidgety/restless 0 0  Suicidal  thoughts 0 0  PHQ-9 Score 1 0  Difficult doing work/chores Not difficult at all Not difficult at all    Quality of Life: Quality of Life - 07/11/17 1006      Quality of Life Scores   Health/Function Pre  26.46 %    Health/Function Post  24.77 %    Health/Function % Change  -6.39 %    Socioeconomic Pre  28 %    Socioeconomic Post  25.07 %    Socioeconomic % Change   -10.46 %    Psych/Spiritual Pre  23.79 %    Psych/Spiritual Post  23.57 %    Psych/Spiritual % Change  -0.92 %    Family Pre  30 %    Family Post  27 %    Family % Change  -10 %    GLOBAL Pre  26.28 %    GLOBAL Post  24.71 %    GLOBAL % Change  -5.97 %       Personal Goals: Goals established at orientation with interventions provided to work toward goal. Personal Goals and Risk Factors at Admission - 05/23/17 1229      Core Components/Risk Factors/Patient Goals on Admission    Weight Management  Yes;Obesity    Intervention  Weight Management: Develop a combined nutrition and exercise program designed to reach desired caloric intake, while maintaining appropriate intake of nutrient and fiber, sodium and fats, and appropriate energy  expenditure required for the weight goal.;Weight Management: Provide education and appropriate resources to help participant work on and attain dietary goals.;Weight Management/Obesity: Establish reasonable short term and long term weight goals.;Obesity: Provide education and appropriate resources to help participant work on and attain dietary goals.    Admit Weight  222 lb (100.7 kg)    Goal Weight: Short Term  217 lb (98.4 kg)    Goal Weight: Long Term  180 lb (81.6 kg)    Expected Outcomes  Short Term: Continue to assess and modify interventions until short term weight is achieved;Long Term: Adherence to nutrition and physical activity/exercise program aimed toward attainment of established weight goal;Weight Loss: Understanding of general recommendations for a balanced deficit meal plan,  which promotes 1-2 lb weight loss per week and includes a negative energy balance of [phone] kcal/d;Understanding recommendations for meals to include 15-35% energy as protein, 25-35% energy from fat, 35-60% energy from carbohydrates, less than 217m of dietary cholesterol, 20-35 gm of total fiber daily;Understanding of distribution of calorie intake throughout the day with the consumption of 4-5 meals/snacks    Hypertension  Yes    Intervention  Provide education on lifestyle modifcations including regular physical activity/exercise, weight management, moderate sodium restriction and increased consumption of fresh fruit, vegetables, and low fat dairy, alcohol moderation, and smoking cessation.;Monitor prescription use compliance.    Expected Outcomes  Short Term: Continued assessment and intervention until BP is < 140/990mHG in hypertensive participants. < 130/8075mG in hypertensive participants with diabetes, heart failure or chronic kidney disease.;Long Term: Maintenance of blood pressure at goal levels.    Lipids  Yes    Intervention  Provide education and support for participant on nutrition & aerobic/resistive exercise along with prescribed medications to achieve LDL <40m60mDL >40mg47m Expected Outcomes  Short Term: Participant states understanding of desired cholesterol values and is compliant with medications prescribed. Participant is following exercise prescription and nutrition guidelines.;Long Term: Cholesterol controlled with medications as prescribed, with individualized exercise RX and with personalized nutrition plan. Value goals: LDL < 40mg,56m > 40 mg.    Personal Goal Other  Yes    Personal Goal  ThomasArlyn like to learn more about cardiac education because this event came as a surprise.     Intervention  During [facility], education will be provided about heart disease and other components of the program    Expected Outcomes  Short Term: ThomasAlbertaattent [facility]; Long Term:  ThomasEdrickapply lifestyle changes that [facility]ome more aware of heart disease        Personal Goals Discharge: Goals and Risk Factor Review    Row Name 06/15/17 0749 07/18/17 0937           Core Components/Risk Factors/Patient Goals Review   Personal Goals Review  Weight Management/Obesity;Hypertension;Lipids;Other  Weight Management/Obesity;Hypertension;Lipids      Review  Tommy Konrad Doloreseen doing well in rehab.  He has lost about 5-6 lbs, but has stalled out some.  He is down to 215 lbs at home.  He has reduced his eating and portion control. We talked about adding in intervals to help with weight loss.  Tommy checks his blood pressures at home and keeps a record.  He has been getting higher numbers at home than we are here and he is going to bring in his machine to check against ours.  He feels that his medications are working for him.  He has learned  a lot from education classes.  He continues to struggle with diet and we will make him an appointment.   Konrad Dolores stated that he is steadly losing weight and following dietary guidelines and taking meds to control risk factors.       Expected Outcomes  Short: Add in intervals and meet with dietician for weight loss.  Long: Continue to work on risk factor modifications.   Short: weight loss goal of 200 lbs. Long: Continue to work on risk factor modification.          Exercise Goals and Review: Exercise Goals    Row Name 05/23/17 1550             Exercise Goals   Increase Physical Activity  Yes       Intervention  Provide advice, education, support and counseling about physical activity/exercise needs.;Develop an individualized exercise prescription for aerobic and resistive training based on initial evaluation findings, risk stratification, comorbidities and participant's personal goals.       Expected Outcomes  Achievement of increased cardiorespiratory fitness and enhanced flexibility, muscular endurance and  strength shown through measurements of functional capacity and personal statement of participant.       Increase Strength and Stamina  Yes       Intervention  Provide advice, education, support and counseling about physical activity/exercise needs.;Develop an individualized exercise prescription for aerobic and resistive training based on initial evaluation findings, risk stratification, comorbidities and participant's personal goals.       Expected Outcomes  Achievement of increased cardiorespiratory fitness and enhanced flexibility, muscular endurance and strength shown through measurements of functional capacity and personal statement of participant.       Able to understand and use rate of perceived exertion (RPE) scale  Yes       Intervention  Provide education and explanation on how to use RPE scale       Expected Outcomes  Short Term: Able to use RPE daily in rehab to express subjective intensity level;Long Term:  Able to use RPE to guide intensity level when exercising independently       Knowledge and understanding of Target Heart Rate Range (THRR)  Yes       Intervention  Provide education and explanation of THRR including how the numbers were predicted and where they are located for reference       Expected Outcomes  Short Term: Able to state/look up THRR;Long Term: Able to use THRR to govern intensity when exercising independently;Short Term: Able to use daily as guideline for intensity in rehab       Able to check pulse independently  Yes       Intervention  Provide education and demonstration on how to check pulse in carotid and radial arteries.;Review the importance of being able to check your own pulse for safety during independent exercise       Expected Outcomes  Short Term: Able to explain why pulse checking is important during independent exercise;Long Term: Able to check pulse independently and accurately       Understanding of Exercise Prescription  Yes       Intervention  Provide  education, explanation, and written materials on patient's individual exercise prescription       Expected Outcomes  Short Term: Able to explain program exercise prescription;Long Term: Able to explain home exercise prescription to exercise independently          Nutrition & Weight - Outcomes: Pre Biometrics - 05/23/17 1551  Pre Biometrics   Height  5' 9.5" (1.765 m)    Weight  222 lb (100.7 kg)    Waist Circumference  43.25 inches    Hip Circumference  40.5 inches    Waist to Hip Ratio  1.07 %    BMI (Calculated)  32.32    Single Leg Stand  30 seconds        Nutrition: Nutrition Therapy & Goals - 07/04/17 1110      Nutrition Therapy   Diet  Instrutcted on a meal plan based on heart healthy principles.    Drug/Food Interactions  Statins/Certain Fruits    Protein (specify units)  8    Fiber  25 grams    Whole Grain Foods  3 servings    Saturated Fats  13 max. grams    Fruits and Vegetables  5 servings/day 8 servings ideal    Sodium  1500 grams      Personal Nutrition Goals   Nutrition Goal  Try "I Can't Believe it's not butter" spray.    Personal Goal #2  Try a variety of Ms. DASH seasonings    Personal Goal #3  Read labels for saturated fat, trans fat and sodium.      Intervention Plan   Intervention  Prescribe, educate and counsel regarding individualized specific dietary modifications aiming towards targeted core components such as weight, hypertension, lipid management, diabetes, heart failure and other comorbidities.;Nutrition handout(s) given to patient.    Expected Outcomes  Short Term Goal: Understand basic principles of dietary content, such as calories, fat, sodium, cholesterol and nutrients.;Short Term Goal: A plan has been developed with personal nutrition goals set during dietitian appointment.;Long Term Goal: Adherence to prescribed nutrition plan.       Nutrition Discharge: Nutrition Assessments - 07/11/17 1005      MEDFICTS Scores   Pre Score  74     Post Score  6    Score Difference  -68       Education Questionnaire Score: Knowledge Questionnaire Score - 07/11/17 1005      Knowledge Questionnaire Score   Pre Score  20/28    Post Score  28/28       Goals reviewed with patient; copy given to patient.

## 2017-07-22 NOTE — Progress Notes (Signed)
Cardiac Individual Treatment Plan  Patient Details  Name: Billy Walker MRN: [id number] Date of Birth: 1946-02-18 Referring Provider:     Cardiac Rehab from 05/23/2017 in [facility] Cardiac and Pulmonary Rehab  Referring Provider  Isaias Cowman MD      Initial Encounter Date:    Cardiac Rehab from 05/23/2017 in [facility] Cardiac and Pulmonary Rehab  Date  05/23/17  Referring Provider  Isaias Cowman MD      Visit Diagnosis: Status post coronary artery stent placement  Patient's Home Medications on Admission:  Current Outpatient Medications:  .  aspirin EC 81 MG tablet, Take 81 mg by mouth daily., Disp: , Rfl:  .  atorvastatin (LIPITOR) 40 MG tablet, Take 1 tablet (40 mg total) by mouth daily at 6 PM., Disp: 30 tablet, Rfl: 0 .  clopidogrel (PLAVIX) 75 MG tablet, Take 75 mg by mouth at bedtime., Disp: , Rfl:  .  enalapril (VASOTEC) 5 MG tablet, Take 1 tablet (5 mg total) by mouth daily., Disp: 30 tablet, Rfl: 0 .  metoprolol tartrate (LOPRESSOR) 25 MG tablet, Take 1 tablet (25 mg total) by mouth 2 (two) times daily., Disp: 60 tablet, Rfl: 0 .  nitroGLYCERIN (NITROSTAT) 0.4 MG SL tablet, Place 1 tablet (0.4 mg total) under the tongue every 5 (five) minutes as needed for chest pain., Disp: 30 tablet, Rfl: 0  Past Medical History: Past Medical History:  Diagnosis Date  . Hypertension   . Prostate cancer ([place])     Tobacco Use: Social History   Tobacco Use  Smoking Status Never Smoker  Smokeless Tobacco Never Used    Labs: Recent Review Scientist, physiological    Labs for ITP Cardiac and Pulmonary Rehab Latest Ref Rng & Units 04/10/2017 04/11/2017   Cholestrol 0 - 200 mg/dL 195 171   LDLCALC 0 - 99 mg/dL 125(H) 115(H)   HDL >40 mg/dL 34(L) 29(L)   Trlycerides <150 mg/dL 182(H) 133       Exercise Target Goals:    Exercise Program Goal: Individual exercise prescription set with THRR, safety & activity barriers. Participant demonstrates ability to understand and report RPE  using BORG scale, to self-measure pulse accurately, and to acknowledge the importance of the exercise prescription.  Exercise Prescription Goal: Starting with aerobic activity 30 plus minutes a day, 3 days per week for initial exercise prescription. Provide home exercise prescription and guidelines that participant acknowledges understanding prior to discharge.  Activity Barriers & Risk Stratification: Activity Barriers & Cardiac Risk Stratification - 05/23/17 1239      Activity Barriers & Cardiac Risk Stratification   Activity Barriers  Joint Problems;Deconditioning;Muscular Weakness occasional left knee pain    Cardiac Risk Stratification  Moderate       6 Minute Walk: 6 Minute Walk    Row Name 05/23/17 1546 07/13/17 0935       6 Minute Walk   Phase  Initial  Discharge    Distance  1550 feet  1975 feet    Distance % Change  -  27.4 %    Distance Feet Change  -  425 ft    Walk Time  6 minutes  6 minutes    # of Rest Breaks  0  0    MPH  2.94  3.74    METS  2.99  4.08    RPE  12  13    Perceived Dyspnea   12  -    VO2 Peak  10.49  14.19    Symptoms  No  No    Resting HR  55 bpm  60 bpm    Resting BP  122/66  122/66    Resting Oxygen Saturation   100 %  -    Exercise Oxygen Saturation  during 6 min walk  100 %  -    Max Ex. HR  78 bpm  111 bpm    Max Ex. BP  164/74  146/64    2 Minute Post BP  120/66  -       Oxygen Initial Assessment:   Oxygen Re-Evaluation: Oxygen Re-Evaluation    Row Name 06/02/17 1457             Program Oxygen Prescription   Program Oxygen Prescription  None         Home Oxygen   Home Oxygen Device  None       Home Exercise Oxygen Prescription  None       Home at Rest Exercise Oxygen Prescription  None          Oxygen Discharge (Final Oxygen Re-Evaluation): Oxygen Re-Evaluation - 06/02/17 1457      Program Oxygen Prescription   Program Oxygen Prescription  None      Home Oxygen   Home Oxygen Device  None    Home Exercise  Oxygen Prescription  None    Home at Rest Exercise Oxygen Prescription  None       Initial Exercise Prescription: Initial Exercise Prescription - 05/23/17 1500      Date of Initial Exercise RX and Referring Provider   Date  05/23/17    Referring Provider  Isaias Cowman MD      Treadmill   MPH  2.4    Grade  0.5    Minutes  15    METs  3      Bike   Level  1.1    Watts  32    Minutes  15    METs  2.9      NuStep   Level  3    SPM  80    Minutes  15    METs  3      Prescription Details   Frequency (times per week)  3    Duration  Progress to 45 minutes of aerobic exercise without signs/symptoms of physical distress      Intensity   THRR 40-80% of Max Heartrate  93-130    Ratings of Perceived Exertion  11-13    Perceived Dyspnea  0-4      Progression   Progression  Continue to progress workloads to maintain intensity without signs/symptoms of physical distress.      Resistance Training   Training Prescription  Yes    Weight  4 lbs    Reps  10-15       Perform Capillary Blood Glucose checks as needed.  Exercise Prescription Changes:  Exercise Prescription Changes    Row Name 05/23/17 1300 06/01/17 0800 06/08/17 1500 06/22/17 1500 07/05/17 1400     Response to Exercise   Blood Pressure (Admit)  122/66  -  114/70  122/68  110/60   Blood Pressure (Exercise)  164/74  -  152/76  134/64  130/60   Blood Pressure (Exit)  120/66  -  110/66  124/64  116/60   Heart Rate (Admit)  55 bpm  -  63 bpm  75 bpm  66 bpm   Heart Rate (Exercise)  80 bpm  -  119 bpm  116 bpm  111 bpm   Heart Rate (Exit)  66 bpm  -  64 bpm  79 bpm  77 bpm   Oxygen Saturation (Admit)  100 %  -  -  -  -   Oxygen Saturation (Exercise)  100 %  -  -  -  -   Rating of Perceived Exertion (Exercise)  12  -  12  13  12    Symptoms  none  -  none  none  none   Comments  walk test results  -  -  -  -   Duration  -  -  Progress to 45 minutes of aerobic exercise without signs/symptoms of physical  distress  Continue with 45 min of aerobic exercise without signs/symptoms of physical distress.  Continue with 45 min of aerobic exercise without signs/symptoms of physical distress.   Intensity  -  -  THRR unchanged  THRR unchanged  THRR unchanged     Progression   Progression  -  -  Continue to progress workloads to maintain intensity without signs/symptoms of physical distress.  Continue to progress workloads to maintain intensity without signs/symptoms of physical distress.  Continue to progress workloads to maintain intensity without signs/symptoms of physical distress.   Average METs  -  -  5.86  4.41  5.28     Resistance Training   Training Prescription  -  -  Yes  Yes  Yes   Weight  -  -  4 lb  4 lb  4 lb   Reps  -  -  10-15  10-15  10-15     Interval Training   Interval Training  -  -  No  No  No     Treadmill   MPH  -  -  3  3.5  3.5   Grade  -  -  1  1.5  15   Minutes  -  -  15  15  15    METs  -  -  3.71  4.4  4.4     Bike   Level  -  -  -  2  2   Watts  -  -  -  32  90   Minutes  -  -  -  15  15   METs  -  -  -  2.94  4.83     NuStep   Level  -  -  5  5  6    SPM  -  -  80  -  -   Minutes  -  -  15  15  15    METs  -  -  5.8  6.6  6.6     Home Exercise Plan   Plans to continue exercise at  -  Home (comment) walking at home  Home (comment) walking at home  Home (comment) walking at home  Home (comment) walking at home   Frequency  -  Add 1 additional day to program exercise sessions.  Add 1 additional day to program exercise sessions.  Add 2 additional days to program exercise sessions.  Add 2 additional days to program exercise sessions.   Initial Home Exercises Provided  -  06/01/17  06/01/17  06/01/17  06/01/17      Exercise Comments:  Exercise Comments    Row Name 05/27/17 0839 07/22/17 1026         Exercise Comments  First full day of  exercise!  Patient was oriented to gym and equipment including functions, settings, policies, and procedures.  Patient's  individual exercise prescription and treatment plan were reviewed.  All starting workloads were established based on the results of the 6 minute walk test done at initial orientation visit.  The plan for exercise progression was also introduced and progression will be customized based on patient's performance and goals.  Kirtan graduated today from cardiac rehab with 36/36 sessions completed.  Details of the patient's exercise prescription and what He needs to do in order to continue the prescription and progress were discussed with patient.  Patient was given a copy of prescription and goals.  Patient verbalized understanding.  Maurilio plans to continue to exercise by walking at home and joining our [employer] three days a week.         Exercise Goals and Review:  Exercise Goals    Row Name 05/23/17 1550             Exercise Goals   Increase Physical Activity  Yes       Intervention  Provide advice, education, support and counseling about physical activity/exercise needs.;Develop an individualized exercise prescription for aerobic and resistive training based on initial evaluation findings, risk stratification, comorbidities and participant's personal goals.       Expected Outcomes  Achievement of increased cardiorespiratory fitness and enhanced flexibility, muscular endurance and strength shown through measurements of functional capacity and personal statement of participant.       Increase Strength and Stamina  Yes       Intervention  Provide advice, education, support and counseling about physical activity/exercise needs.;Develop an individualized exercise prescription for aerobic and resistive training based on initial evaluation findings, risk stratification, comorbidities and participant's personal goals.       Expected Outcomes  Achievement of increased cardiorespiratory fitness and enhanced flexibility, muscular endurance and strength shown through measurements of functional  capacity and personal statement of participant.       Able to understand and use rate of perceived exertion (RPE) scale  Yes       Intervention  Provide education and explanation on how to use RPE scale       Expected Outcomes  Short Term: Able to use RPE daily in rehab to express subjective intensity level;Long Term:  Able to use RPE to guide intensity level when exercising independently       Knowledge and understanding of Target Heart Rate Range (THRR)  Yes       Intervention  Provide education and explanation of THRR including how the numbers were predicted and where they are located for reference       Expected Outcomes  Short Term: Able to state/look up THRR;Long Term: Able to use THRR to govern intensity when exercising independently;Short Term: Able to use daily as guideline for intensity in rehab       Able to check pulse independently  Yes       Intervention  Provide education and demonstration on how to check pulse in carotid and radial arteries.;Review the importance of being able to check your own pulse for safety during independent exercise       Expected Outcomes  Short Term: Able to explain why pulse checking is important during independent exercise;Long Term: Able to check pulse independently and accurately       Understanding of Exercise Prescription  Yes       Intervention  Provide education, explanation, and written materials on patient's individual  exercise prescription       Expected Outcomes  Short Term: Able to explain program exercise prescription;Long Term: Able to explain home exercise prescription to exercise independently          Exercise Goals Re-Evaluation : Exercise Goals Re-Evaluation    Row Name 05/27/17 0839 06/01/17 0816 06/08/17 1536 06/15/17 0747 07/05/17 1416     Exercise Goal Re-Evaluation   Exercise Goals Review  Able to understand and use rate of perceived exertion (RPE) scale;Knowledge and understanding of Target Heart Rate Range (THRR);Understanding  of Exercise Prescription  Increase Physical Activity;Increase Strength and Stamina;Able to understand and use rate of perceived exertion (RPE) scale;Able to check pulse independently;Knowledge and understanding of Target Heart Rate Range (THRR);Understanding of Exercise Prescription  Increase Physical Activity;Increase Strength and Stamina  Increase Physical Activity;Increase Strength and Stamina;Understanding of Exercise Prescription  Increase Physical Activity;Increase Strength and Stamina;Understanding of Exercise Prescription   Comments  Reviewed RPE scale, THR and program prescription with pt today.  Pt voiced understanding and was given a copy of goals to take home.   Reviewed home exercise with pt today.  Pt plans to walk 2 extra days a week for exercise.  Reviewed THR, pulse, RPE, sign and symptoms, NTG use, and when to call 911 or MD.  Also discussed weather considerations and indoor options.  Pt voiced understanding.Billy Walker is progressing well and has added speed and incline to intital exercise intensity.  Billy Walker has been doing well in rehab.  He has trouble getting in his exercise on off days due to his work schedule.  He is walking on the weekends and he does try to walk after work if time allows.  Billy Walker feels that his strength and stamina was not lacking, but he doesn't feel as tired now as he did before.   Billy Walker continues to do well in rehab.  He is now up to level 6 on the NuStep and getting 90 watts on the bike.  He is starting to near graduation and will be doing his walk test next week.  We will continue to monitor his progression.    Expected Outcomes  Short: Use RPE daily to regulate intensity.  Long: Follow program prescription in THR.  Short: add 2 extra days of walking at home. Long: Exercise independently   [place] will continue to attend and exercise at home.  Long - Billy Walker will maintain fitness gains.  Short: Try to get in more walks after work.  Long: Continue to exercise  independently.  Short: Improve on his post 6MWT.  Long: Continue to try to get more exercise at home.   [place] Name 07/18/17 0935             Exercise Goal Re-Evaluation   Exercise Goals Review  Increase Physical Activity;Increase Strength and Stamina;Understanding of Exercise Prescription       Comments  Billy Walker will graduate from cardiac rehab this week.        Expected Outcomes  Shot: Billy Walker will participate in the [employer] upon graduation starting the beginning of December. Long: Billy Walker will continue to gain strength and stamina while exercising safely and independently upon graduation.           Discharge Exercise Prescription (Final Exercise Prescription Changes): Exercise Prescription Changes - 07/05/17 1400      Response to Exercise   Blood Pressure (Admit)  110/60    Blood Pressure (Exercise)  130/60    Blood Pressure (Exit)  116/60  Heart Rate (Admit)  66 bpm    Heart Rate (Exercise)  111 bpm    Heart Rate (Exit)  77 bpm    Rating of Perceived Exertion (Exercise)  12    Symptoms  none    Duration  Continue with 45 min of aerobic exercise without signs/symptoms of physical distress.    Intensity  THRR unchanged      Progression   Progression  Continue to progress workloads to maintain intensity without signs/symptoms of physical distress.    Average METs  5.28      Resistance Training   Training Prescription  Yes    Weight  4 lb    Reps  10-15      Interval Training   Interval Training  No      Treadmill   MPH  3.5    Grade  15    Minutes  15    METs  4.4      Bike   Level  2    Watts  90    Minutes  15    METs  4.83      NuStep   Level  6    Minutes  15    METs  6.6      Home Exercise Plan   Plans to continue exercise at  Home (comment) walking at home    Frequency  Add 2 additional days to program exercise sessions.    Initial Home Exercises Provided  06/01/17       Nutrition:  Target Goals: Understanding of nutrition guidelines,  daily intake of sodium <1556m, cholesterol <2040m calories 30% from fat and 7% or less from saturated fats, daily to have 5 or more servings of fruits and vegetables.  Biometrics: Pre Biometrics - 05/23/17 1551      Pre Biometrics   Height  5' 9.5" (1.765 m)    Weight  222 lb (100.7 kg)    Waist Circumference  43.25 inches    Hip Circumference  40.5 inches    Waist to Hip Ratio  1.07 %    BMI (Calculated)  32.32    Single Leg Stand  30 seconds        Nutrition Therapy Plan and Nutrition Goals: Nutrition Therapy & Goals - 07/04/17 1110      Nutrition Therapy   Diet  Instrutcted on a meal plan based on heart healthy principles.    Drug/Food Interactions  Statins/Certain Fruits    Protein (specify units)  8    Fiber  25 grams    Whole Grain Foods  3 servings    Saturated Fats  13 max. grams    Fruits and Vegetables  5 servings/day 8 servings ideal    Sodium  1500 grams      Personal Nutrition Goals   Nutrition Goal  Try "I Can't Believe it's not butter" spray.    Personal Goal #2  Try a variety of Ms. DASH seasonings    Personal Goal #3  Read labels for saturated fat, trans fat and sodium.      Intervention Plan   Intervention  Prescribe, educate and counsel regarding individualized specific dietary modifications aiming towards targeted core components such as weight, hypertension, lipid management, diabetes, heart failure and other comorbidities.;Nutrition handout(s) given to patient.    Expected Outcomes  Short Term Goal: Understand basic principles of dietary content, such as calories, fat, sodium, cholesterol and nutrients.;Short Term Goal: A plan has been developed with personal nutrition goals  set during dietitian appointment.;Long Term Goal: Adherence to prescribed nutrition plan.       Nutrition Discharge: Rate Your Plate Scores: Nutrition Assessments - 07/11/17 1005      MEDFICTS Scores   Pre Score  74    Post Score  6    Score Difference  -68        Nutrition Goals Re-Evaluation: Nutrition Goals Re-Evaluation    Row Name 06/15/17 0756 07/18/17 0941           Goals   Current Weight  220 lb (99.8 kg)  -      Nutrition Goal  Meet with dietician to discuss weight loss diet  -      Comment  Appointment scheduled for 11/12 at 10am  Met with dietician and is following recommendations for heart healthy diet.       Expected Outcome  Short: Meet with dietician  Long: Heart Healhty diet  Will continue to follow heart healthy diet upon graduation form cardiac rehab.          Nutrition Goals Discharge (Final Nutrition Goals Re-Evaluation): Nutrition Goals Re-Evaluation - 07/18/17 0941      Goals   Comment  Met with dietician and is following recommendations for heart healthy diet.     Expected Outcome  Will continue to follow heart healthy diet upon graduation form cardiac rehab.        Psychosocial: Target Goals: Acknowledge presence or absence of significant depression and/or stress, maximize coping skills, provide positive support system. Participant is able to verbalize types and ability to use techniques and skills needed for reducing stress and depression.   Initial Review & Psychosocial Screening: Initial Psych Review & Screening - 05/23/17 1234      Initial Review   Current issues with  Current Stress Concerns    Source of Stress Concerns  Unable to participate in former interests or hobbies;Unable to perform yard/household activities      [place]?  Yes      Screening Interventions   Interventions  Yes;Encouraged to exercise;Program counselor consult    Expected Outcomes  Short Term goal: Utilizing psychosocial counselor, staff and physician to assist with identification of specific Stressors or current issues interfering with healing process. Setting desired goal for each stressor or current issue identified.;Long Term Goal: Stressors or current issues are controlled or eliminated.;Short  Term goal: Identification and review with participant of any Quality of Life or Depression concerns found by scoring the questionnaire.;Long Term goal: The participant improves quality of Life and PHQ9 Scores as seen by post scores and/or verbalization of changes       Quality of Life Scores:  Quality of Life - 07/11/17 1006      Quality of Life Scores   Health/Function Pre  26.46 %    Health/Function Post  24.77 %    Health/Function % Change  -6.39 %    Socioeconomic Pre  28 %    Socioeconomic Post  25.07 %    Socioeconomic % Change   -10.46 %    Psych/Spiritual Pre  23.79 %    Psych/Spiritual Post  23.57 %    Psych/Spiritual % Change  -0.92 %    Family Pre  30 %    Family Post  27 %    Family % Change  -10 %    GLOBAL Pre  26.28 %    GLOBAL Post  24.71 %    GLOBAL % Change  -  5.97 %       PHQ-9: Recent Review Flowsheet Data    Depression screen [facility] 2/9 07/11/2017 05/23/2017   Decreased Interest 0 0   Down, Depressed, Hopeless 1 0   PHQ - 2 Score 1 0   Altered sleeping 0 0   Tired, decreased energy 0 0   Change in appetite 0 0   Feeling bad or failure about yourself  0 0   Trouble concentrating 0 0   Moving slowly or fidgety/restless 0 0   Suicidal thoughts 0 0   PHQ-9 Score 1 0   Difficult doing work/chores Not difficult at all Not difficult at all     Interpretation of Total Score  Total Score Depression Severity:  1-4 = Minimal depression, 5-9 = Mild depression, 10-14 = Moderate depression, 15-19 = Moderately severe depression, 20-27 = Severe depression   Psychosocial Evaluation and Intervention: Psychosocial Evaluation - 07/13/17 0948      Discharge Psychosocial Assessment & Intervention   Comments  Counselor met with Billy Walker today for discharge evaluation.  He reports experiencing a great deal of progress since coming into this program.  He has increased confidence about his ability to exercise and he has lost some weight as well.  Billy Walker states his mood is better  - although he continues to have Billy stress - but he reports coping better and has a plan to graduate retirement and learning to let go.  Billy Walker plans to begin [employer] 2-3x per week beginning in a few weeks.  Counselor commended [employer] for his progress and hard work and his continued commitment to incorporating exercise into his lifestyle.         Psychosocial Re-Evaluation: Psychosocial Re-Evaluation    Row Name 06/15/17 0757 06/29/17 1016 07/18/17 0945         Psychosocial Re-Evaluation   Current issues with  Current Stress Concerns  -  -     Comments  Billy Walker is feeling pretty good and enjoys coming to rehab. Work continues to be his biggest stressor.  He has been able to return to some of his former activities just not lifting yet.  He is also feeling a little gun shy to really go at it.  We talked aout how intervals can help boost this confidence as well.  He is still sleeping well.  Counselor follow up with Billy Walker today reporting he is feeling stronger since coming into this class and continues to sleep well and be in a positive mood.  He enjoys exercising and reports it is a positive coping strategy for his Billy stress.  Counselor commended [place] on his progress and hard work.  Continues to feel stronger with no new psychosocial concerns.      Expected Outcomes  Short: Add in intervals and return to yard work.  Long: Continue to maintain postive attitude!  Billy Walker will continue to exercise consistently and will benefit from the stress management component of this program to learn more positive ways to cope with his Billy stress.    Billy Walker will continue to exercise consistently and will benefit from the stress management component of this program to learn more positive ways to cope with his Billy stress.       Interventions  Encouraged to attend Cardiac Rehabilitation for the exercise  Stress management education  -     Continue Psychosocial Services   Follow up required by staff  Follow up required by  staff  -  Initial Review   Source of Stress Concerns  Unable to perform yard/household activities;Chronic Illness;Occupation  -  -        Psychosocial Discharge (Final Psychosocial Re-Evaluation): Psychosocial Re-Evaluation - 07/18/17 0945      Psychosocial Re-Evaluation   Comments  Continues to feel stronger with no new psychosocial concerns.     Expected Outcomes  Billy Walker will continue to exercise consistently and will benefit from the stress management component of this program to learn more positive ways to cope with his Billy stress.         Vocational Rehabilitation: Provide vocational rehab assistance to qualifying candidates.   Vocational Rehab Evaluation & Intervention: Vocational Rehab - 05/23/17 1238      Initial Vocational Rehab Evaluation & Intervention   Assessment shows need for Vocational Rehabilitation  No       Education: Education Goals: Education classes will be provided on a variety of topics geared toward better understanding of heart health and risk factor modification. Participant will state understanding/return demonstration of topics presented as noted by education test scores.  Learning Barriers/Preferences: Learning Barriers/Preferences - 05/23/17 1237      Learning Barriers/Preferences   Learning Barriers  None    Learning Preferences  None       Education Topics: General Nutrition Guidelines/Fats and Fiber: -Group instruction provided by verbal, written material, models and posters to present the general guidelines for heart healthy nutrition. Gives an explanation and review of dietary fats and fiber.   Cardiac Rehab from 07/20/2017 in [facility] Cardiac and Pulmonary Rehab  Date  06/06/17  Educator  CR  Instruction Review Code  1- Verbalizes Understanding      Controlling Sodium/Reading Food Labels: -Group verbal and written material supporting the discussion of sodium use in heart healthy nutrition. Review and explanation with models,  verbal and written materials for utilization of the food label.   Cardiac Rehab from 07/20/2017 in [facility] Cardiac and Pulmonary Rehab  Date  06/13/17  Educator  CR  Instruction Review Code  1- Verbalizes Understanding      Exercise Physiology & Risk Factors: - Group verbal and written instruction with models to review the exercise physiology of the cardiovascular system and associated critical values. Details cardiovascular disease risk factors and the goals associated with each risk factor.   Cardiac Rehab from 07/20/2017 in [facility] Cardiac and Pulmonary Rehab  Date  06/29/17  Educator  [facility]  Instruction Review Code  1- Verbalizes Understanding      Aerobic Exercise & Resistance Training: - Gives group verbal and written discussion on the health impact of inactivity. On the components of aerobic and resistive training programs and the benefits of this training and how to safely progress through these programs.   Cardiac Rehab from 07/20/2017 in [facility] Cardiac and Pulmonary Rehab  Date  06/22/17  Educator  [facility]  Instruction Review Code  1- Verbalizes Understanding      Flexibility, Balance, General Exercise Guidelines: - Provides group verbal and written instruction on the benefits of flexibility and balance training programs. Provides general exercise guidelines with specific guidelines to those with heart or lung disease. Demonstration and skill practice provided.   Cardiac Rehab from 07/20/2017 in [facility] Cardiac and Pulmonary Rehab  Date  06/27/17  Educator  [facility]  Instruction Review Code  1- Verbalizes Understanding      Stress Management: - Provides group verbal and written instruction about the health risks of elevated stress, cause of high stress, and healthy ways to reduce  stress.   Cardiac Rehab from 07/20/2017 in [facility] Cardiac and Pulmonary Rehab  Date  07/06/17  Educator  [facility]  Instruction Review Code  1- Verbalizes Understanding      Depression: - Provides group verbal and  written instruction on the correlation between heart/lung disease and depressed mood, treatment options, and the stigmas associated with seeking treatment.   Cardiac Rehab from 07/20/2017 in [facility] Cardiac and Pulmonary Rehab  Date  06/08/17  Educator  [facility]  Instruction Review Code  1- Verbalizes Understanding      Anatomy & Physiology of the Heart: - Group verbal and written instruction and models provide basic cardiac anatomy and physiology, with the coronary electrical and arterial systems. Review of: AMI, Angina, Valve disease, Heart Failure, Cardiac Arrhythmia, Pacemakers, and the ICD.   Cardiac Rehab from 07/20/2017 in [facility] Cardiac and Pulmonary Rehab  Date  07/04/17  Educator  CE  Instruction Review Code  1- Verbalizes Understanding      Cardiac Procedures: - Group verbal and written instruction to review commonly prescribed medications for heart disease. Reviews the medication, class of the drug, and side effects. Includes the steps to properly store meds and maintain the prescription regimen. (beta blockers and nitrates)   Cardiac Rehab from 07/20/2017 in [facility] Cardiac and Pulmonary Rehab  Date  07/11/17  Educator  CE  Instruction Review Code  1- Verbalizes Understanding      Cardiac Medications I: - Group verbal and written instruction to review commonly prescribed medications for heart disease. Reviews the medication, class of the drug, and side effects. Includes the steps to properly store meds and maintain the prescription regimen.   Cardiac Rehab from 07/20/2017 in [facility] Cardiac and Pulmonary Rehab  Date  07/18/17  Educator  CE  Instruction Review Code  1- Verbalizes Understanding      Cardiac Medications II: -Group verbal and written instruction to review commonly prescribed medications for heart disease. Reviews the medication, class of the drug, and side effects. (all other drug classes)    Go Sex-Intimacy & Heart Disease, Get SMART - Goal Setting: - Group verbal  and written instruction through game format to discuss heart disease and the return to sexual intimacy. Provides group verbal and written material to discuss and apply goal setting through the application of the S.M.A.R.T. Method.   Cardiac Rehab from 07/20/2017 in [facility] Cardiac and Pulmonary Rehab  Date  07/11/17  Educator  CE  Instruction Review Code  1- Verbalizes Understanding      Other Matters of the Heart: - Provides group verbal, written materials and models to describe Heart Failure, Angina, Valve Disease, Peripheral Artery Disease, and Diabetes in the realm of heart disease. Includes description of the disease process and treatment options available to the cardiac patient.   Cardiac Rehab from 07/20/2017 in [facility] Cardiac and Pulmonary Rehab  Date  07/04/17  Educator  CE  Instruction Review Code  1- Verbalizes Understanding      Exercise & Equipment Safety: - Individual verbal instruction and demonstration of equipment use and safety with use of the equipment.   Cardiac Rehab from 07/20/2017 in [facility] Cardiac and Pulmonary Rehab  Date  05/23/17  Educator  [facility]  Instruction Review Code  1- Verbalizes Understanding      Infection Prevention: - Provides verbal and written material to individual with discussion of infection control including proper hand washing and proper equipment cleaning during exercise session.   Cardiac Rehab from 07/20/2017 in [facility] Cardiac and Pulmonary Rehab  Date  05/23/17  Educator  [place]  Instruction Review Code  1- Verbalizes Understanding      Falls Prevention: - Provides verbal and written material to individual with discussion of falls prevention and safety.   Cardiac Rehab from 07/20/2017 in [facility] Cardiac and Pulmonary Rehab  Date  05/23/17  Educator  [facility]  Instruction Review Code  1- Verbalizes Understanding      Diabetes: - Individual verbal and written instruction to review signs/symptoms of diabetes, desired ranges of glucose level fasting,  after meals and with exercise. Acknowledge that pre and post exercise glucose checks will be done for 3 sessions at entry of program.   Other: -Provides group and verbal instruction on various topics (see comments)    Knowledge Questionnaire Score: Knowledge Questionnaire Score - 07/11/17 1005      Knowledge Questionnaire Score   Pre Score  20/28    Post Score  28/28       Core Components/Risk Factors/Patient Goals at Admission: Personal Goals and Risk Factors at Admission - 05/23/17 1229      Core Components/Risk Factors/Patient Goals on Admission    Weight Management  Yes;Obesity    Intervention  Weight Management: Develop a combined nutrition and exercise program designed to reach desired caloric intake, while maintaining appropriate intake of nutrient and fiber, sodium and fats, and appropriate energy expenditure required for the weight goal.;Weight Management: Provide education and appropriate resources to help participant work on and attain dietary goals.;Weight Management/Obesity: Establish reasonable short term and long term weight goals.;Obesity: Provide education and appropriate resources to help participant work on and attain dietary goals.    Admit Weight  222 lb (100.7 kg)    Goal Weight: Short Term  217 lb (98.4 kg)    Goal Weight: Long Term  180 lb (81.6 kg)    Expected Outcomes  Short Term: Continue to assess and modify interventions until short term weight is achieved;Long Term: Adherence to nutrition and physical activity/exercise program aimed toward attainment of established weight goal;Weight Loss: Understanding of general recommendations for a balanced deficit meal plan, which promotes 1-2 lb weight loss per week and includes a negative energy balance of [phone] kcal/d;Understanding recommendations for meals to include 15-35% energy as protein, 25-35% energy from fat, 35-60% energy from carbohydrates, less than 237m of dietary cholesterol, 20-35 gm of total fiber  daily;Understanding of distribution of calorie intake throughout the day with the consumption of 4-5 meals/snacks    Hypertension  Yes    Intervention  Provide education on lifestyle modifcations including regular physical activity/exercise, weight management, moderate sodium restriction and increased consumption of fresh fruit, vegetables, and low fat dairy, alcohol moderation, and smoking cessation.;Monitor prescription use compliance.    Expected Outcomes  Short Term: Continued assessment and intervention until BP is < 140/923mHG in hypertensive participants. < 130/8010mG in hypertensive participants with diabetes, heart failure or chronic kidney disease.;Long Term: Maintenance of blood pressure at goal levels.    Lipids  Yes    Intervention  Provide education and support for participant on nutrition & aerobic/resistive exercise along with prescribed medications to achieve LDL <108m24mDL >40mg7m Expected Outcomes  Short Term: Participant states understanding of desired cholesterol values and is compliant with medications prescribed. Participant is following exercise prescription and nutrition guidelines.;Long Term: Cholesterol controlled with medications as prescribed, with individualized exercise RX and with personalized nutrition plan. Value goals: LDL < 108mg,37m > 40 mg.    Personal Goal Other  Yes  Personal Goal  Masen would like to learn more about cardiac education because this event came as a surprise.     Intervention  During [facility], education will be provided about heart disease and other components of the program    Expected Outcomes  Short Term: Samule will attent [facility]; Long Term: Theoden will apply lifestyle changes that he has learned in [facility] and become more aware of heart disease       Core Components/Risk Factors/Patient Goals Review:  Goals and Risk Factor Review    Row Name 06/15/17 0749 07/18/17 0937           Core Components/Risk Factors/Patient Goals  Review   Personal Goals Review  Weight Management/Obesity;Hypertension;Lipids;Other  Weight Management/Obesity;Hypertension;Lipids      Review  Billy Walker has been doing well in rehab.  He has lost about 5-6 lbs, but has stalled out some.  He is down to 215 lbs at home.  He has reduced his eating and portion control. We talked about adding in intervals to help with weight loss.  Billy Walker checks his blood pressures at home and keeps a record.  He has been getting higher numbers at home than we are here and he is going to bring in his machine to check against ours.  He feels that his medications are working for him.  He has learned a lot from education classes.  He continues to struggle with diet and we will make him an appointment.   Billy Walker stated that he is steadly losing weight and following dietary guidelines and taking meds to control risk factors.       Expected Outcomes  Short: Add in intervals and meet with dietician for weight loss.  Long: Continue to work on risk factor modifications.   Short: weight loss goal of 200 lbs. Long: Continue to work on risk factor modification.          Core Components/Risk Factors/Patient Goals at Discharge (Final Review):  Goals and Risk Factor Review - 07/18/17 0937      Core Components/Risk Factors/Patient Goals Review   Personal Goals Review  Weight Management/Obesity;Hypertension;Lipids    Review  Billy Walker stated that he is steadly losing weight and following dietary guidelines and taking meds to control risk factors.     Expected Outcomes  Short: weight loss goal of 200 lbs. Long: Continue to work on risk factor modification.        ITP Comments: ITP Comments    Row Name 05/23/17 1223 06/01/17 0624 06/29/17 0625 07/22/17 1026     ITP Comments  Med Review Completed. Initial ITP created. Diagnosis can be found in [facility] 05/04/17  30 day Review. Continue with ITP unless directed changes per Medical Director Review.   30 day review. Continue with ITP unless directed  changes per Medical Director review.   Discharge ITP sent and signed by Dr. Lequita Halt (covering for Medical Director Emily Filbert).  Discharge Summary routed to PCP and cardiologist.       Comments: Discharge ITP

## 2017-07-25 ENCOUNTER — Ambulatory Visit: Payer: Medicare Other

## 2017-07-27 ENCOUNTER — Ambulatory Visit: Payer: Medicare Other

## 2017-07-27 ENCOUNTER — Encounter: Payer: Self-pay | Admitting: *Deleted

## 2017-07-27 DIAGNOSIS — Z955 Presence of coronary angioplasty implant and graft: Secondary | ICD-10-CM

## 2017-07-27 NOTE — Progress Notes (Signed)
Cardiac Individual Treatment Plan  Patient Details  Name: Billy Walker MRN: [id number] Date of Birth: Dec 31, 1945 Referring Provider:     Cardiac Rehab from 05/23/2017 in [facility] Cardiac and Pulmonary Rehab  Referring Provider  Isaias Cowman MD      Initial Encounter Date:    Cardiac Rehab from 05/23/2017 in [facility] Cardiac and Pulmonary Rehab  Date  05/23/17  Referring Provider  Isaias Cowman MD      Visit Diagnosis: Status post coronary artery stent placement  Patient's Home Medications on Admission:  Current Outpatient Medications:  .  aspirin EC 81 MG tablet, Take 81 mg by mouth daily., Disp: , Rfl:  .  atorvastatin (LIPITOR) 40 MG tablet, Take 1 tablet (40 mg total) by mouth daily at 6 PM., Disp: 30 tablet, Rfl: 0 .  clopidogrel (PLAVIX) 75 MG tablet, Take 75 mg by mouth at bedtime., Disp: , Rfl:  .  enalapril (VASOTEC) 5 MG tablet, Take 1 tablet (5 mg total) by mouth daily., Disp: 30 tablet, Rfl: 0 .  metoprolol tartrate (LOPRESSOR) 25 MG tablet, Take 1 tablet (25 mg total) by mouth 2 (two) times daily., Disp: 60 tablet, Rfl: 0 .  nitroGLYCERIN (NITROSTAT) 0.4 MG SL tablet, Place 1 tablet (0.4 mg total) under the tongue every 5 (five) minutes as needed for chest pain., Disp: 30 tablet, Rfl: 0  Past Medical History: Past Medical History:  Diagnosis Date  . Hypertension   . Prostate cancer ([place])     Tobacco Use: Social History   Tobacco Use  Smoking Status Never Smoker  Smokeless Tobacco Never Used    Labs: Recent Review Scientist, physiological    Labs for ITP Cardiac and Pulmonary Rehab Latest Ref Rng & Units 04/10/2017 04/11/2017   Cholestrol 0 - 200 mg/dL 195 171   LDLCALC 0 - 99 mg/dL 125(H) 115(H)   HDL >40 mg/dL 34(L) 29(L)   Trlycerides <150 mg/dL 182(H) 133       Exercise Target Goals:    Exercise Program Goal: Individual exercise prescription set with THRR, safety & activity barriers. Participant demonstrates ability to understand and report RPE  using BORG scale, to self-measure pulse accurately, and to acknowledge the importance of the exercise prescription.  Exercise Prescription Goal: Starting with aerobic activity 30 plus minutes a day, 3 days per week for initial exercise prescription. Provide home exercise prescription and guidelines that participant acknowledges understanding prior to discharge.  Activity Barriers & Risk Stratification:   6 Minute Walk: 6 Minute Walk    Row Name 07/13/17 0935         6 Minute Walk   Phase  Discharge     Distance  1975 feet     Distance % Change  27.4 %     Distance Feet Change  425 ft     Walk Time  6 minutes     # of Rest Breaks  0     MPH  3.74     METS  4.08     RPE  13     VO2 Peak  14.19     Symptoms  No     Resting HR  60 bpm     Resting BP  122/66     Max Ex. HR  111 bpm     Max Ex. BP  146/64        Oxygen Initial Assessment:   Oxygen Re-Evaluation: Oxygen Re-Evaluation    Row Name 06/02/17 1457  Program Oxygen Prescription   Program Oxygen Prescription  None         Home Oxygen   Home Oxygen Device  None       Home Exercise Oxygen Prescription  None       Home at Rest Exercise Oxygen Prescription  None          Oxygen Discharge (Final Oxygen Re-Evaluation): Oxygen Re-Evaluation - 06/02/17 1457      Program Oxygen Prescription   Program Oxygen Prescription  None      Home Oxygen   Home Oxygen Device  None    Home Exercise Oxygen Prescription  None    Home at Rest Exercise Oxygen Prescription  None       Initial Exercise Prescription:   Perform Capillary Blood Glucose checks as needed.  Exercise Prescription Changes: Exercise Prescription Changes    Row Name 06/01/17 0800 06/08/17 1500 06/22/17 1500 07/05/17 1400       Response to Exercise   Blood Pressure (Admit)  -  114/70  122/68  110/60    Blood Pressure (Exercise)  -  152/76  134/64  130/60    Blood Pressure (Exit)  -  110/66  124/64  116/60    Heart Rate (Admit)   -  63 bpm  75 bpm  66 bpm    Heart Rate (Exercise)  -  119 bpm  116 bpm  111 bpm    Heart Rate (Exit)  -  64 bpm  79 bpm  77 bpm    Rating of Perceived Exertion (Exercise)  -  '12  13  12    ' Symptoms  -  none  none  none    Duration  -  Progress to 45 minutes of aerobic exercise without signs/symptoms of physical distress  Continue with 45 min of aerobic exercise without signs/symptoms of physical distress.  Continue with 45 min of aerobic exercise without signs/symptoms of physical distress.    Intensity  -  THRR unchanged  THRR unchanged  THRR unchanged      Progression   Progression  -  Continue to progress workloads to maintain intensity without signs/symptoms of physical distress.  Continue to progress workloads to maintain intensity without signs/symptoms of physical distress.  Continue to progress workloads to maintain intensity without signs/symptoms of physical distress.    Average METs  -  5.86  4.41  5.28      Resistance Training   Training Prescription  -  Yes  Yes  Yes    Weight  -  4 lb  4 lb  4 lb    Reps  -  10-15  10-15  10-15      Interval Training   Interval Training  -  No  No  No      Treadmill   MPH  -  3  3.5  3.5    Grade  -  1  1.5  15    Minutes  -  '15  15  15    ' METs  -  3.71  4.4  4.4      Bike   Level  -  -  2  2    Watts  -  -  32  90    Minutes  -  -  15  15    METs  -  -  2.94  4.83      NuStep   Level  -  5  5  6  SPM  -  80  -  -    Minutes  -  '15  15  15    ' METs  -  5.8  6.6  6.6      Home Exercise Plan   Plans to continue exercise at  Home (comment) walking at home  Home (comment) walking at home  Home (comment) walking at home  Home (comment) walking at home    Frequency  Add 1 additional day to program exercise sessions.  Add 1 additional day to program exercise sessions.  Add 2 additional days to program exercise sessions.  Add 2 additional days to program exercise sessions.    Initial Home Exercises Provided  06/01/17  06/01/17   06/01/17  06/01/17       Exercise Comments: Exercise Comments    Row Name 07/22/17 1026           Exercise Comments  Billy Walker graduated today from cardiac rehab with 36/36 sessions completed.  Details of the patient's exercise prescription and what He needs to do in order to continue the prescription and progress were discussed with patient.  Patient was given a copy of prescription and goals.  Patient verbalized understanding.  Billy Walker plans to continue to exercise by walking at home and joining our [employer] three days a week.          Exercise Goals and Review:   Exercise Goals Re-Evaluation : Exercise Goals Re-Evaluation    Row Name 06/01/17 0816 06/08/17 1536 06/15/17 0747 07/05/17 1416 07/18/17 0935     Exercise Goal Re-Evaluation   Exercise Goals Review  Increase Physical Activity;Increase Strength and Stamina;Able to understand and use rate of perceived exertion (RPE) scale;Able to check pulse independently;Knowledge and understanding of Target Heart Rate Range (THRR);Understanding of Exercise Prescription  Increase Physical Activity;Increase Strength and Stamina  Increase Physical Activity;Increase Strength and Stamina;Understanding of Exercise Prescription  Increase Physical Activity;Increase Strength and Stamina;Understanding of Exercise Prescription  Increase Physical Activity;Increase Strength and Stamina;Understanding of Exercise Prescription   Comments  Reviewed home exercise with pt today.  Pt plans to walk 2 extra days a week for exercise.  Reviewed THR, pulse, RPE, sign and symptoms, NTG use, and when to call 911 or MD.  Also discussed weather considerations and indoor options.  Pt voiced understanding.Billy Walker is progressing well and has added speed and incline to intital exercise intensity.  Billy Walker has been doing well in rehab.  He has trouble getting in his exercise on off days due to his work schedule.  He is walking on the weekends and he does try to walk after  work if time allows.  Billy Walker feels that his strength and stamina was not lacking, but he doesn't feel as tired now as he did before.   Billy Walker continues to do well in rehab.  He is now up to level 6 on the NuStep and getting 90 watts on the bike.  He is starting to near graduation and will be doing his walk test next week.  We will continue to monitor his progression.   Billy Walker will graduate from cardiac rehab this week.    Expected Outcomes  Short: add 2 extra days of walking at home. Long: Exercise independently   Billy Walker will continue to attend and exercise at home.  Long - Billy Walker will maintain fitness gains.  Short: Try to get in more walks after work.  Long: Continue to exercise independently.  Short: Improve on his post 6MWT.  Long: Continue to try to get more exercise at home.  Shot: Billy Walker will participate in the [employer] upon graduation starting the beginning of December. Long: Billy Walker will continue to gain strength and stamina while exercising safely and independently upon graduation.       Discharge Exercise Prescription (Final Exercise Prescription Changes): Exercise Prescription Changes - 07/05/17 1400      Response to Exercise   Blood Pressure (Admit)  110/60    Blood Pressure (Exercise)  130/60    Blood Pressure (Exit)  116/60    Heart Rate (Admit)  66 bpm    Heart Rate (Exercise)  111 bpm    Heart Rate (Exit)  77 bpm    Rating of Perceived Exertion (Exercise)  12    Symptoms  none    Duration  Continue with 45 min of aerobic exercise without signs/symptoms of physical distress.    Intensity  THRR unchanged      Progression   Progression  Continue to progress workloads to maintain intensity without signs/symptoms of physical distress.    Average METs  5.28      Resistance Training   Training Prescription  Yes    Weight  4 lb    Reps  10-15      Interval Training   Interval Training  No      Treadmill   MPH  3.5    Grade  15    Minutes  15    METs  4.4       Bike   Level  2    Watts  90    Minutes  15    METs  4.83      NuStep   Level  6    Minutes  15    METs  6.6      Home Exercise Plan   Plans to continue exercise at  Home (comment) walking at home    Frequency  Add 2 additional days to program exercise sessions.    Initial Home Exercises Provided  06/01/17       Nutrition:  Target Goals: Understanding of nutrition guidelines, daily intake of sodium <1532m, cholesterol <2021m calories 30% from fat and 7% or less from saturated fats, daily to have 5 or more servings of fruits and vegetables.  Biometrics:    Nutrition Therapy Plan and Nutrition Goals: Nutrition Therapy & Goals - 07/04/17 1110      Nutrition Therapy   Diet  Instrutcted on a meal plan based on heart healthy principles.    Drug/Food Interactions  Statins/Certain Fruits    Protein (specify units)  8    Fiber  25 grams    Whole Grain Foods  3 servings    Saturated Fats  13 max. grams    Fruits and Vegetables  5 servings/day 8 servings ideal    Sodium  1500 grams      Personal Nutrition Goals   Nutrition Goal  Try "I Can't Believe it's not butter" spray.    Personal Goal #2  Try a variety of Ms. DASH seasonings    Personal Goal #3  Read labels for saturated fat, trans fat and sodium.      Intervention Plan   Intervention  Prescribe, educate and counsel regarding individualized specific dietary modifications aiming towards targeted core components such as weight, hypertension, lipid management, diabetes, heart failure and other comorbidities.;Nutrition handout(s) given to patient.    Expected Outcomes  Short Term Goal: Understand basic principles of  dietary content, such as calories, fat, sodium, cholesterol and nutrients.;Short Term Goal: A plan has been developed with personal nutrition goals set during dietitian appointment.;Long Term Goal: Adherence to prescribed nutrition plan.       Nutrition Discharge: Rate Your Plate Scores: Nutrition Assessments -  07/11/17 1005      MEDFICTS Scores   Pre Score  74    Post Score  6    Score Difference  -68       Nutrition Goals Re-Evaluation: Nutrition Goals Re-Evaluation    Row Name 06/15/17 0756 07/18/17 0941           Goals   Current Weight  220 lb (99.8 kg)  -      Nutrition Goal  Meet with dietician to discuss weight loss diet  -      Comment  Appointment scheduled for 11/12 at 10am  Met with dietician and is following recommendations for heart healthy diet.       Expected Outcome  Short: Meet with dietician  Long: Heart Healhty diet  Will continue to follow heart healthy diet upon graduation form cardiac rehab.          Nutrition Goals Discharge (Final Nutrition Goals Re-Evaluation): Nutrition Goals Re-Evaluation - 07/18/17 0941      Goals   Comment  Met with dietician and is following recommendations for heart healthy diet.     Expected Outcome  Will continue to follow heart healthy diet upon graduation form cardiac rehab.        Psychosocial: Target Goals: Acknowledge presence or absence of significant depression and/or stress, maximize coping skills, provide positive support system. Participant is able to verbalize types and ability to use techniques and skills needed for reducing stress and depression.   Initial Review & Psychosocial Screening:   Quality of Life Scores:  Quality of Life - 07/11/17 1006      Quality of Life Scores   Health/Function Pre  26.46 %    Health/Function Post  24.77 %    Health/Function % Change  -6.39 %    Socioeconomic Pre  28 %    Socioeconomic Post  25.07 %    Socioeconomic % Change   -10.46 %    Psych/Spiritual Pre  23.79 %    Psych/Spiritual Post  23.57 %    Psych/Spiritual % Change  -0.92 %    Family Pre  30 %    Family Post  27 %    Family % Change  -10 %    GLOBAL Pre  26.28 %    GLOBAL Post  24.71 %    GLOBAL % Change  -5.97 %       PHQ-9: Recent Review Flowsheet Data    Depression screen [facility] 2/9 07/11/2017 05/23/2017    Decreased Interest 0 0   Down, Depressed, Hopeless 1 0   PHQ - 2 Score 1 0   Altered sleeping 0 0   Tired, decreased energy 0 0   Change in appetite 0 0   Feeling bad or failure about yourself  0 0   Trouble concentrating 0 0   Moving slowly or fidgety/restless 0 0   Suicidal thoughts 0 0   PHQ-9 Score 1 0   Difficult doing work/chores Not difficult at all Not difficult at all     Interpretation of Total Score  Total Score Depression Severity:  1-4 = Minimal depression, 5-9 = Mild depression, 10-14 = Moderate depression, 15-19 = Moderately severe depression, 20-27 =  Severe depression   Psychosocial Evaluation and Intervention: Psychosocial Evaluation - 07/13/17 0948      Discharge Psychosocial Assessment & Intervention   Comments  Counselor met with Billy Walker today for discharge evaluation.  He reports experiencing a great deal of progress since coming into this program.  He has increased confidence about his ability to exercise and he has lost some weight as well.  Billy Walker states his mood is better - although he continues to have job stress - but he reports coping better and has a plan to graduate retirement and learning to let go.  Billy Walker plans to begin [employer] 2-3x per week beginning in a few weeks.  Counselor commended Billy Walker for his progress and hard work and his continued commitment to incorporating exercise into his lifestyle.         Psychosocial Re-Evaluation: Psychosocial Re-Evaluation    Row Name 06/15/17 0757 06/29/17 1016 07/18/17 0945         Psychosocial Re-Evaluation   Current issues with  Current Stress Concerns  -  -     Comments  Billy Walker is feeling pretty good and enjoys coming to rehab. Work continues to be his biggest stressor.  He has been able to return to some of his former activities just not lifting yet.  He is also feeling a little gun shy to really go at it.  We talked aout how intervals can help boost this confidence as well.  He is still sleeping well.   Counselor follow up with Billy Walker today reporting he is feeling stronger since coming into this class and continues to sleep well and be in a positive mood.  He enjoys exercising and reports it is a positive coping strategy for his job stress.  Counselor commended Billy Walker on his progress and hard work.  Continues to feel stronger with no new psychosocial concerns.      Expected Outcomes  Short: Add in intervals and return to yard work.  Long: Continue to maintain postive attitude!  Billy Walker will continue to exercise consistently and will benefit from the stress management component of this program to learn more positive ways to cope with his job stress.    Billy Walker will continue to exercise consistently and will benefit from the stress management component of this program to learn more positive ways to cope with his job stress.       Interventions  Encouraged to attend Cardiac Rehabilitation for the exercise  Stress management education  -     Continue Psychosocial Services   Follow up required by staff  Follow up required by staff  -       Initial Review   Source of Stress Concerns  Unable to perform yard/household activities;Chronic Illness;Occupation  -  -        Psychosocial Discharge (Final Psychosocial Re-Evaluation): Psychosocial Re-Evaluation - 07/18/17 0945      Psychosocial Re-Evaluation   Comments  Continues to feel stronger with no new psychosocial concerns.     Expected Outcomes  Billy Walker will continue to exercise consistently and will benefit from the stress management component of this program to learn more positive ways to cope with his job stress.         Vocational Rehabilitation: Provide vocational rehab assistance to qualifying candidates.   Vocational Rehab Evaluation & Intervention:   Education: Education Goals: Education classes will be provided on a variety of topics geared toward better understanding of heart health and risk factor modification. Participant will state  understanding/return demonstration  of topics presented as noted by education test scores.  Learning Barriers/Preferences:   Education Topics: General Nutrition Guidelines/Fats and Fiber: -Group instruction provided by verbal, written material, models and posters to present the general guidelines for heart healthy nutrition. Gives an explanation and review of dietary fats and fiber.   Cardiac Rehab from 07/20/2017 in [facility] Cardiac and Pulmonary Rehab  Date  06/06/17  Educator  CR  Instruction Review Code  1- Verbalizes Understanding      Controlling Sodium/Reading Food Labels: -Group verbal and written material supporting the discussion of sodium use in heart healthy nutrition. Review and explanation with models, verbal and written materials for utilization of the food label.   Cardiac Rehab from 07/20/2017 in [facility] Cardiac and Pulmonary Rehab  Date  06/13/17  Educator  CR  Instruction Review Code  1- Verbalizes Understanding      Exercise Physiology & Risk Factors: - Group verbal and written instruction with models to review the exercise physiology of the cardiovascular system and associated critical values. Details cardiovascular disease risk factors and the goals associated with each risk factor.   Cardiac Rehab from 07/20/2017 in [facility] Cardiac and Pulmonary Rehab  Date  06/29/17  Educator  [facility]  Instruction Review Code  1- Verbalizes Understanding      Aerobic Exercise & Resistance Training: - Gives group verbal and written discussion on the health impact of inactivity. On the components of aerobic and resistive training programs and the benefits of this training and how to safely progress through these programs.   Cardiac Rehab from 07/20/2017 in [facility] Cardiac and Pulmonary Rehab  Date  06/22/17  Educator  [facility]  Instruction Review Code  1- Verbalizes Understanding      Flexibility, Balance, General Exercise Guidelines: - Provides group verbal and written instruction on the  benefits of flexibility and balance training programs. Provides general exercise guidelines with specific guidelines to those with heart or lung disease. Demonstration and skill practice provided.   Cardiac Rehab from 07/20/2017 in [facility] Cardiac and Pulmonary Rehab  Date  06/27/17  Educator  [facility]  Instruction Review Code  1- Verbalizes Understanding      Stress Management: - Provides group verbal and written instruction about the health risks of elevated stress, cause of high stress, and healthy ways to reduce stress.   Cardiac Rehab from 07/20/2017 in [facility] Cardiac and Pulmonary Rehab  Date  07/06/17  Educator  [facility]  Instruction Review Code  1- Verbalizes Understanding      Depression: - Provides group verbal and written instruction on the correlation between heart/lung disease and depressed mood, treatment options, and the stigmas associated with seeking treatment.   Cardiac Rehab from 07/20/2017 in [facility] Cardiac and Pulmonary Rehab  Date  06/08/17  Educator  [facility]  Instruction Review Code  1- Verbalizes Understanding      Anatomy & Physiology of the Heart: - Group verbal and written instruction and models provide basic cardiac anatomy and physiology, with the coronary electrical and arterial systems. Review of: AMI, Angina, Valve disease, Heart Failure, Cardiac Arrhythmia, Pacemakers, and the ICD.   Cardiac Rehab from 07/20/2017 in [facility] Cardiac and Pulmonary Rehab  Date  07/04/17  Educator  CE  Instruction Review Code  1- Verbalizes Understanding      Cardiac Procedures: - Group verbal and written instruction to review commonly prescribed medications for heart disease. Reviews the medication, class of the drug, and side effects. Includes the steps to properly store meds and maintain the prescription regimen. (  beta blockers and nitrates)   Cardiac Rehab from 07/20/2017 in [facility] Cardiac and Pulmonary Rehab  Date  07/11/17  Educator  CE  Instruction Review Code  1- Verbalizes  Understanding      Cardiac Medications I: - Group verbal and written instruction to review commonly prescribed medications for heart disease. Reviews the medication, class of the drug, and side effects. Includes the steps to properly store meds and maintain the prescription regimen.   Cardiac Rehab from 07/20/2017 in [facility] Cardiac and Pulmonary Rehab  Date  07/18/17  Educator  CE  Instruction Review Code  1- Verbalizes Understanding      Cardiac Medications II: -Group verbal and written instruction to review commonly prescribed medications for heart disease. Reviews the medication, class of the drug, and side effects. (all other drug classes)    Go Sex-Intimacy & Heart Disease, Get SMART - Goal Setting: - Group verbal and written instruction through game format to discuss heart disease and the return to sexual intimacy. Provides group verbal and written material to discuss and apply goal setting through the application of the S.M.A.R.T. Method.   Cardiac Rehab from 07/20/2017 in [facility] Cardiac and Pulmonary Rehab  Date  07/11/17  Educator  CE  Instruction Review Code  1- Verbalizes Understanding      Other Matters of the Heart: - Provides group verbal, written materials and models to describe Heart Failure, Angina, Valve Disease, Peripheral Artery Disease, and Diabetes in the realm of heart disease. Includes description of the disease process and treatment options available to the cardiac patient.   Cardiac Rehab from 07/20/2017 in [facility] Cardiac and Pulmonary Rehab  Date  07/04/17  Educator  CE  Instruction Review Code  1- Verbalizes Understanding      Exercise & Equipment Safety: - Individual verbal instruction and demonstration of equipment use and safety with use of the equipment.   Cardiac Rehab from 07/20/2017 in [facility] Cardiac and Pulmonary Rehab  Date  05/23/17  Educator  [facility]  Instruction Review Code  1- Verbalizes Understanding      Infection Prevention: - Provides  verbal and written material to individual with discussion of infection control including proper hand washing and proper equipment cleaning during exercise session.   Cardiac Rehab from 07/20/2017 in [facility] Cardiac and Pulmonary Rehab  Date  05/23/17  Educator  [facility]  Instruction Review Code  1- Verbalizes Understanding      Falls Prevention: - Provides verbal and written material to individual with discussion of falls prevention and safety.   Cardiac Rehab from 07/20/2017 in [facility] Cardiac and Pulmonary Rehab  Date  05/23/17  Educator  [facility]  Instruction Review Code  1- Verbalizes Understanding      Diabetes: - Individual verbal and written instruction to review signs/symptoms of diabetes, desired ranges of glucose level fasting, after meals and with exercise. Acknowledge that pre and post exercise glucose checks will be done for 3 sessions at entry of program.   Other: -Provides group and verbal instruction on various topics (see comments)    Knowledge Questionnaire Score: Knowledge Questionnaire Score - 07/11/17 1005      Knowledge Questionnaire Score   Pre Score  20/28    Post Score  28/28       Core Components/Risk Factors/Patient Goals at Admission:   Core Components/Risk Factors/Patient Goals Review:  Goals and Risk Factor Review    Row Name 06/15/17 0749 07/18/17 0937           Core Components/Risk Factors/Patient Goals  Review   Personal Goals Review  Weight Management/Obesity;Hypertension;Lipids;Other  Weight Management/Obesity;Hypertension;Lipids      Review  Billy Walker has been doing well in rehab.  He has lost about 5-6 lbs, but has stalled out some.  He is down to 215 lbs at home.  He has reduced his eating and portion control. We talked about adding in intervals to help with weight loss.  Billy Walker checks his blood pressures at home and keeps a record.  He has been getting higher numbers at home than we are here and he is going to bring in his machine to check against ours.   He feels that his medications are working for him.  He has learned a lot from education classes.  He continues to struggle with diet and we will make him an appointment.   Billy Walker stated that he is steadly losing weight and following dietary guidelines and taking meds to control risk factors.       Expected Outcomes  Short: Add in intervals and meet with dietician for weight loss.  Long: Continue to work on risk factor modifications.   Short: weight loss goal of 200 lbs. Long: Continue to work on risk factor modification.          Core Components/Risk Factors/Patient Goals at Discharge (Final Review):  Goals and Risk Factor Review - 07/18/17 0937      Core Components/Risk Factors/Patient Goals Review   Personal Goals Review  Weight Management/Obesity;Hypertension;Lipids    Review  Billy Walker stated that he is steadly losing weight and following dietary guidelines and taking meds to control risk factors.     Expected Outcomes  Short: weight loss goal of 200 lbs. Long: Continue to work on risk factor modification.        ITP Comments: ITP Comments    Row Name 06/01/17 0624 06/29/17 0625 07/22/17 1026 07/27/17 0551     ITP Comments  30 day Review. Continue with ITP unless directed changes per Medical Director Review.   30 day review. Continue with ITP unless directed changes per Medical Director review.   Discharge ITP sent and signed by Dr. Lequita Halt (covering for Medical Director Emily Filbert).  Discharge Summary routed to PCP and cardiologist.  30 day review. Continue with ITP unless directed changes per Medical Director review.    Discharged       Comments:

## 2017-07-29 ENCOUNTER — Ambulatory Visit: Payer: Medicare Other

## 2017-08-01 ENCOUNTER — Ambulatory Visit: Payer: Medicare Other

## 2017-08-03 ENCOUNTER — Ambulatory Visit: Payer: Medicare Other

## 2017-08-05 ENCOUNTER — Ambulatory Visit: Payer: Medicare Other

## 2017-08-08 ENCOUNTER — Ambulatory Visit: Payer: Medicare Other

## 2017-08-10 ENCOUNTER — Ambulatory Visit: Payer: Medicare Other

## 2017-08-12 ENCOUNTER — Ambulatory Visit: Payer: Medicare Other

## 2017-08-15 ENCOUNTER — Ambulatory Visit: Payer: Medicare Other

## 2017-08-17 ENCOUNTER — Ambulatory Visit: Payer: Medicare Other

## 2017-08-19 ENCOUNTER — Ambulatory Visit: Payer: Medicare Other

## 2019-03-23 IMAGING — DX DG CHEST 1V PORT
1 series · 1 of 1 positions shown · non-contrast
Comparison: November 20, 2011

CLINICAL DATA: Chest pain

EXAM:
PORTABLE CHEST 1 VIEW

[chest ap]
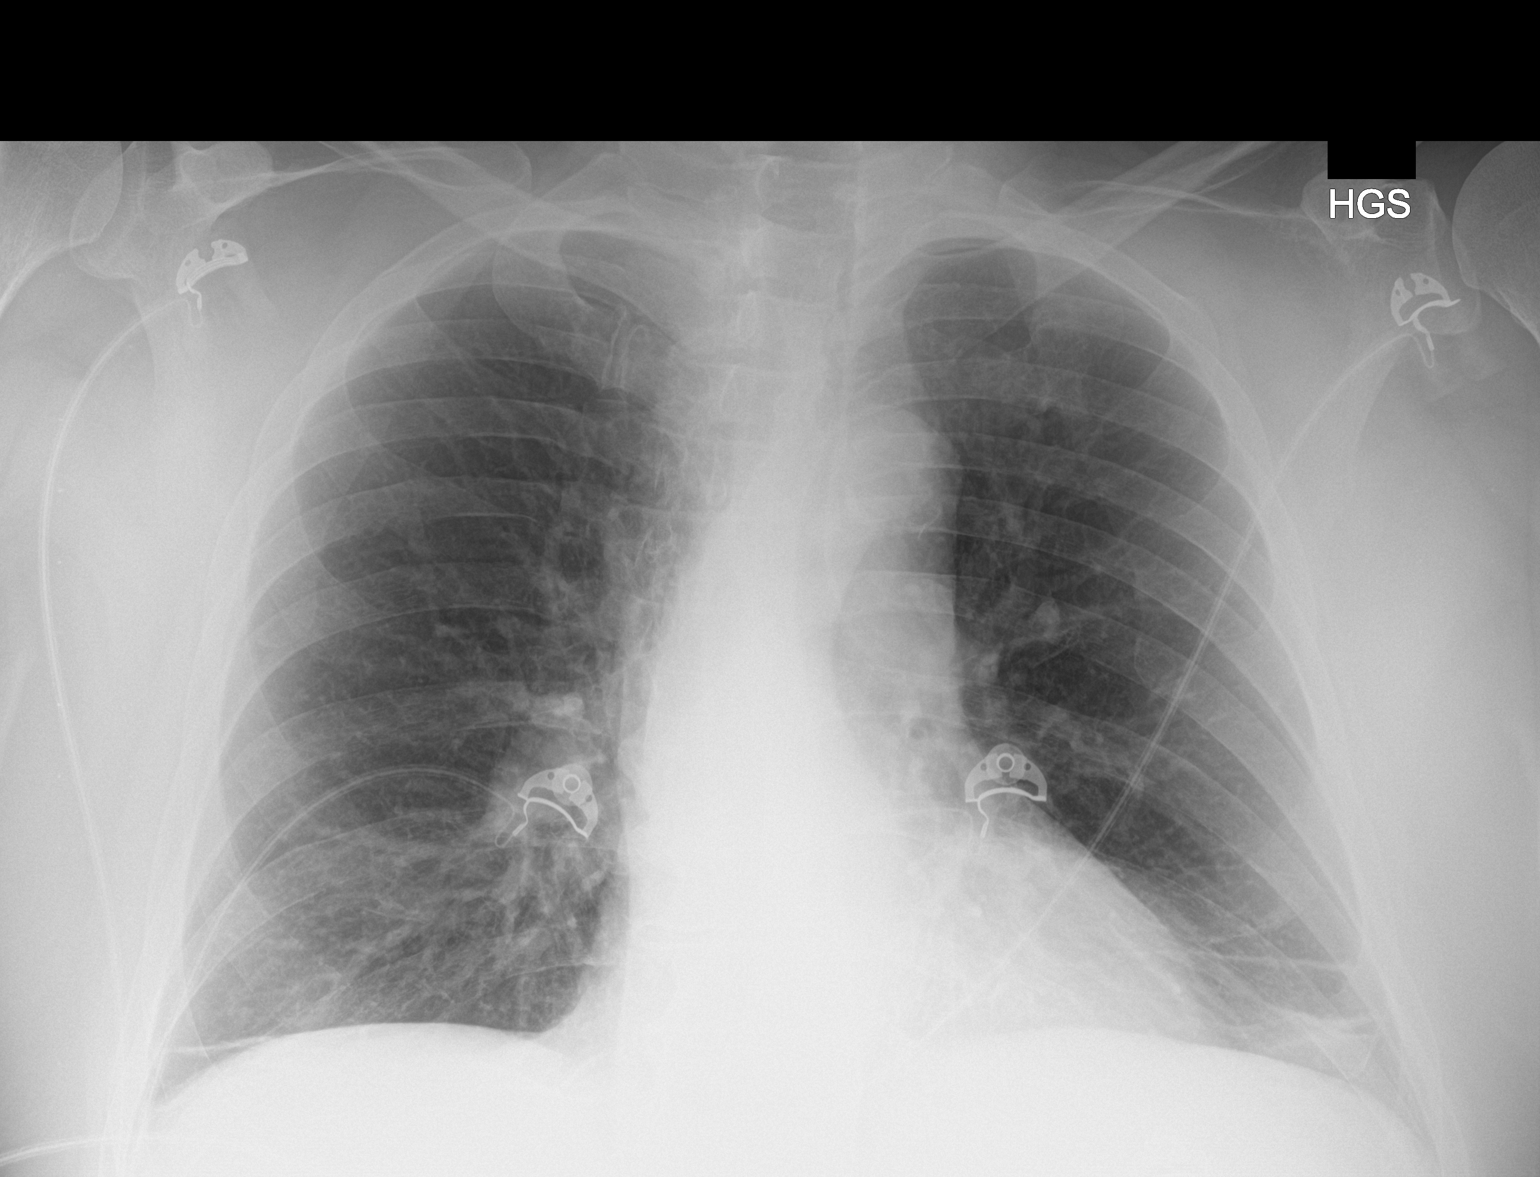

[1 of 1 positions shown; findings below may reference images not displayed]

FINDINGS: There is scarring in the lung bases. Lungs elsewhere clear. Heart
size and pulmonary vascularity are normal. No adenopathy. There is
aortic atherosclerosis. No pneumothorax. No bone lesions.
IMPRESSION: Bibasilar scarring. No edema or consolidation. Stable cardiac
silhouette. There is aortic atherosclerosis.

Aortic Atherosclerosis ([id number]).

## 2019-09-28 ENCOUNTER — Emergency Department: Payer: Medicare Other

## 2019-09-28 ENCOUNTER — Other Ambulatory Visit: Payer: Self-pay

## 2019-09-28 ENCOUNTER — Encounter: Payer: Self-pay | Admitting: Emergency Medicine

## 2019-09-28 ENCOUNTER — Emergency Department
Admission: EM | Admit: 2019-09-28 | Discharge: 2019-09-28 | Disposition: A | Discharge status: Home / Self Care | Payer: Medicare Other | Attending: Emergency Medicine | Admitting: Emergency Medicine

## 2019-09-28 DIAGNOSIS — S0990XA Unspecified injury of head, initial encounter: Secondary | ICD-10-CM | POA: Insufficient documentation

## 2019-09-28 DIAGNOSIS — S42202A Unspecified fracture of upper end of left humerus, initial encounter for closed fracture: Secondary | ICD-10-CM | POA: Insufficient documentation

## 2019-09-28 DIAGNOSIS — Y92821 Forest as the place of occurrence of the external cause: Secondary | ICD-10-CM | POA: Insufficient documentation

## 2019-09-28 DIAGNOSIS — Z79899 Other long term (current) drug therapy: Secondary | ICD-10-CM | POA: Diagnosis not present

## 2019-09-28 DIAGNOSIS — Y998 Other external cause status: Secondary | ICD-10-CM | POA: Insufficient documentation

## 2019-09-28 DIAGNOSIS — Z7982 Long term (current) use of aspirin: Secondary | ICD-10-CM | POA: Diagnosis not present

## 2019-09-28 DIAGNOSIS — I252 Old myocardial infarction: Secondary | ICD-10-CM | POA: Insufficient documentation

## 2019-09-28 DIAGNOSIS — Y93B9 Activity, other involving muscle strengthening exercises: Secondary | ICD-10-CM | POA: Diagnosis not present

## 2019-09-28 DIAGNOSIS — W010XXA Fall on same level from slipping, tripping and stumbling without subsequent striking against object, initial encounter: Secondary | ICD-10-CM | POA: Insufficient documentation

## 2019-09-28 DIAGNOSIS — I10 Essential (primary) hypertension: Secondary | ICD-10-CM | POA: Insufficient documentation

## 2019-09-28 DIAGNOSIS — Z955 Presence of coronary angioplasty implant and graft: Secondary | ICD-10-CM | POA: Insufficient documentation

## 2019-09-28 DIAGNOSIS — Z8546 Personal history of malignant neoplasm of prostate: Secondary | ICD-10-CM | POA: Diagnosis not present

## 2019-09-28 DIAGNOSIS — S4992XA Unspecified injury of left shoulder and upper arm, initial encounter: Secondary | ICD-10-CM | POA: Diagnosis present

## 2019-09-28 MED ORDER — HYDROCODONE-ACETAMINOPHEN 5-325 MG PO TABS: 1.0000 | ORAL_TABLET | Freq: Three times a day (TID) | ORAL | 0 refills | Status: AC | PRN

## 2019-09-28 NOTE — Discharge Instructions (Signed)
You have a fracture in your humerus next to your shoulder.  Please wear shoulder splint.  Please call orthopedics on Monday for a follow-up appointment next week.  You can take Vicodin for extreme pain.

## 2019-09-28 NOTE — ED Triage Notes (Signed)
Presents s/p fall  States he tripped over a root  Having pain to left arm

## 2019-09-28 NOTE — ED Provider Notes (Signed)
[facility] Emergency Department Provider Note  ____________________________________________  Time seen: Approximately 6:59 PM  I have reviewed the triage vital signs and the nursing notes.   HISTORY  Chief Complaint Fall    HPI Billy Walker is a 74 y.o. male that presents to the emergency department for evaluation of left upper arm pain after fall today.  Patient states that he was out doing his daily exercise in the woods when he tripped over a root.  He landed on his left shoulder.  He has been unable to raise his shoulder since fall.  He has also a little sore to his right knee.  He did not hit his head or lose consciousness.  He is on Plavix.  No shortness of breath, chest pain, abdominal pain.  No numbness, tingling.   Past Medical History:  Diagnosis Date  . Hypertension   . Prostate cancer [facility])     Patient Active Problem List   Diagnosis Date Noted  . S/P drug eluting coronary stent placement 05/11/2017  . S/P cardiac cath 05/03/2017  . CAD S/P percutaneous coronary angioplasty 05/03/2017  . Benign hypertension   . Acute ST elevation myocardial infarction (STEMI) involving left anterior descending (LAD) coronary artery ([place]) 04/10/2017  . STEMI (ST elevation myocardial infarction) ([place]) 04/10/2017    Past Surgical History:  Procedure Laterality Date  . CHOLECYSTECTOMY    . CORONARY STENT INTERVENTION N/A 04/10/2017   Procedure: CORONARY STENT INTERVENTION;  Surgeon: Isaias Cowman, MD;  Location: [place] CV LAB;  Service: Cardiovascular;  Laterality: N/A;  . CORONARY STENT INTERVENTION N/A 05/03/2017   Procedure: CORONARY STENT INTERVENTION;  Surgeon: Isaias Cowman, MD;  Location: [place] CV LAB;  Service: Cardiovascular;  Laterality: N/A;  . LEFT HEART CATH AND CORONARY ANGIOGRAPHY N/A 04/10/2017   Procedure: LEFT HEART CATH AND CORONARY ANGIOGRAPHY;  Surgeon: Isaias Cowman, MD;  Location: [place] CV  LAB;  Service: Cardiovascular;  Laterality: N/A;  . PROSTATECTOMY      Prior to Admission medications   Medication Sig Start Date End Date Taking? Authorizing Provider  aspirin EC 81 MG tablet Take 81 mg by mouth daily.    [provider]  atorvastatin (LIPITOR) 40 MG tablet Take 1 tablet (40 mg total) by mouth daily at 6 PM. 04/12/17   Bettey Costa, MD  clopidogrel (PLAVIX) 75 MG tablet Take 75 mg by mouth at bedtime.    [provider]  enalapril (VASOTEC) 5 MG tablet Take 1 tablet (5 mg total) by mouth daily. 04/12/17   Bettey Costa, MD  HYDROcodone-acetaminophen (NORCO/VICODIN) 5-325 MG tablet Take 1 tablet by mouth every 8 (eight) hours as needed for up to 4 days for moderate pain. 09/28/19 10/02/19  Laban Emperor, PA-C  metoprolol tartrate (LOPRESSOR) 25 MG tablet Take 1 tablet (25 mg total) by mouth 2 (two) times daily. 04/12/17   Bettey Costa, MD  nitroGLYCERIN (NITROSTAT) 0.4 MG SL tablet Place 1 tablet (0.4 mg total) under the tongue every 5 (five) minutes as needed for chest pain. 04/12/17   Bettey Costa, MD    Allergies Patient has no known allergies.  Family History  Problem Relation Age of Onset  . Lung cancer Mother   . CAD Father     Social History Social History   Tobacco Use  . Smoking status: Never Smoker  . Smokeless tobacco: Never Used  Substance Use Topics  . Alcohol use: No  . Drug use: No     Review of  Systems  Respiratory: No SOB. Gastrointestinal: No abdominal pain.  No nausea, no vomiting.  Musculoskeletal: Positive for upper arm pain. Skin: Negative for rash, abrasions, lacerations, ecchymosis. Neurological: Negative for headaches, numbness or tingling   ____________________________________________   PHYSICAL EXAM:  VITAL SIGNS: ED Triage Vitals [09/28/19 1805]  Enc Vitals Group     BP (!) 161/86     Pulse Rate 82     Resp 16     Temp 98.5 F (36.9 C)     Temp Source Oral     SpO2 98 %     Weight 208 lb 8 oz (94.6 kg)      Height 5\' 10"  (1.778 m)     Head Circumference      Peak Flow      Pain Score      Pain Loc      Pain Edu?      Excl. in [place]?      Constitutional: Alert and oriented. Well appearing and in no acute distress. Eyes: Conjunctivae are normal. PERRL. EOMI. Head: Atraumatic. ENT:      Ears:      Nose: No congestion/rhinnorhea.      Mouth/Throat: Mucous membranes are moist.  Neck: No stridor.  No cervical spine tenderness to palpation. Cardiovascular: Normal rate, regular rhythm.  Good peripheral circulation.  Symmetric radial pulses bilaterally. Respiratory: Normal respiratory effort without tachypnea or retractions. Lungs CTAB. Good air entry to the bases with no decreased or absent breath sounds. Gastrointestinal: Bowel sounds 4 quadrants. Soft and nontender to palpation. No guarding or rigidity. No palpable masses. No distention.  Musculoskeletal: Full range of motion to all extremities. No gross deformities appreciated.  Tenderness to palpation to left proximal humerus.  Limited range of motion of left shoulder due to pain. Neurologic:  Normal speech and language. No gross focal neurologic deficits are appreciated.  Skin:  Skin is warm, dry and intact. No rash noted. Psychiatric: Mood and affect are normal. Speech and behavior are normal. Patient exhibits appropriate insight and judgement.   ____________________________________________   LABS (all labs ordered are listed, but only abnormal results are displayed)  Labs Reviewed - No data to display ____________________________________________  EKG   ____________________________________________  RADIOLOGY Robinette Haines, personally viewed and evaluated these images (plain radiographs) as part of my medical decision making, as well as reviewing the written report by the radiologist.  CT Head Wo Contrast  Result Date: 09/28/2019 CLINICAL DATA:  Head trauma with headache EXAM: CT HEAD WITHOUT CONTRAST TECHNIQUE: Contiguous  axial images were obtained from the base of the skull through the vertex without intravenous contrast. COMPARISON:  None. FINDINGS: Brain: No evidence of acute infarction, hemorrhage, hydrocephalus, extra-axial collection or mass lesion/mass effect. Mild chronic small vessel ischemic type change in the cerebral white matter. Age normal brain volume. Vascular: No hyperdense vessel or unexpected calcification. Skull: Normal. Negative for fracture or focal lesion. Sinuses/Orbits: No acute finding. IMPRESSION: No evidence of intracranial injury. Electronically Signed   By: Monte Fantasia M.D.   On: 09/28/2019 19:49   CT Shoulder Left Wo Contrast  Result Date: 09/28/2019 CLINICAL DATA:  Tripped, arm injury and pain EXAM: CT OF THE UPPER LEFT EXTREMITY WITHOUT CONTRAST TECHNIQUE: Multidetector CT imaging of the upper left extremity was performed according to the standard protocol. COMPARISON:  None. FINDINGS: Bones/Joint/Cartilage There is comminuted mildly displaced fracture involving the surgical neck and greater tuberosity of the humerus. The humeral head still articulates with the glenoid. The humeral head  appears to be slightly posteriorly rotated on the glenoid surface however. No other fracture is seen. A small glenohumeral joint effusion is seen. Ligaments Suboptimally assessed by CT. Muscles and Tendons The muscles surrounding the shoulder are normal in appearance without focal atrophy or tear. There is however mild edema seen within the deltoid musculature. The rotator cuff tend to ons appear to be grossly intact. Soft tissues Mild soft tissue swelling seen around the anterolateral aspect of the shoulder. Scattered aortic atherosclerosis is noted. Coronary artery calcifications are seen. IMPRESSION: Comminuted mildly displaced fracture involving the humerus surgical neck and greater tuberosity. Electronically Signed   By: Prudencio Pair M.D.   On: 09/28/2019 20:47   DG Knee Complete 4 Views Right  Result  Date: 09/28/2019 CLINICAL DATA:  Tripped and fell over tree stump EXAM: RIGHT KNEE - COMPLETE 4+ VIEW COMPARISON:  None. FINDINGS: No acute bony abnormality. Specifically, no fracture, subluxation, or dislocation. Mild tricompartmental degenerative changes with spurring of the tibial spines. Enthesopathic changes noted on the anterior patellar with corticated ossification in the distal quadriceps tendon likely reflecting remote fragmented enthesophyte. No sizeable joint effusion. Minimal anterior soft tissue swelling. Vascular calcium seen posteriorly. IMPRESSION: 1. No acute bony abnormality. 2. Mild tricompartmental degenerative changes. 3. Minimal anterior soft tissue swelling. Electronically Signed   By: Lovena Le M.D.   On: 09/28/2019 19:38   DG Humerus Left  Result Date: 09/28/2019 CLINICAL DATA:  Extremity pain, fall EXAM: LEFT HUMERUS - 2+ VIEW COMPARISON:  None. FINDINGS: Comminuted fracture of the proximal left humerus with separation of the greater tuberosity. There is associated effusion with lipohemarthrosis (see annotation). Overlying soft tissue swelling is seen. No other humeral fractures are identified. Coracoclavicular and acromioclavicular intervals are maintained. Included portion of the left chest wall is free of acute abnormality. Small amount of bandlike opacity in the left lung base likely atelectasis. Alignment of the elbow is grossly preserved with some mild soft tissue swelling. Included portions of the proximal radius and ulna appear intact. IMPRESSION: Comminuted fracture of the proximal left humerus with separation of the greater tuberosity. Overlying soft tissue swelling and lipohemarthrosis. Electronically Signed   By: Lovena Le M.D.   On: 09/28/2019 18:33    ____________________________________________    PROCEDURES  Procedure(s) performed:    Procedures    Medications - No data to display   ____________________________________________   INITIAL  IMPRESSION / ASSESSMENT AND PLAN / ED COURSE  Pertinent labs & imaging results that were available during my care of the patient were reviewed by me and considered in my medical decision making (see chart for details).  Review of the [place] CSRS was performed in accordance of the [place] prior to dispensing any controlled drugs.   Patient's diagnosis is consistent with proximal humerus fracture.  Vital signs and exam are reassuring.  CT head negative for acute abnormalities.  Knee x-ray consistent with soft tissue swelling without fracture.  Humerus x-ray consistent with comminuted proximal humerus fracture.  Dr. Gaspar Bidding was consulted and recommends that patient be placed in a sling and follow-up outpatient.  Sling was given.  Patient will be discharged home with prescriptions for Vicodin. Patient is to follow up with orthopedics as directed. Patient is given ED precautions to return to the ED for any worsening or new symptoms.  KHYAIR BROOKBANK was evaluated in Emergency Department on 09/28/2019 for the symptoms described in the history of present illness. He was evaluated in the context of the global COVID-19 pandemic, which necessitated  consideration that the patient might be at risk for infection with the SARS-CoV-2 virus that causes COVID-19. Institutional protocols and algorithms that pertain to the evaluation of patients at risk for COVID-19 are in a state of rapid change based on information released by regulatory bodies including the CDC and federal and state organizations. These policies and algorithms were followed during the patient's care in the ED.   ____________________________________________  FINAL CLINICAL IMPRESSION(S) / ED DIAGNOSES  Final diagnoses:  Closed fracture of proximal end of left humerus, unspecified fracture morphology, initial encounter      NEW MEDICATIONS STARTED DURING THIS VISIT:  ED Discharge Orders         Ordered    HYDROcodone-acetaminophen (NORCO/VICODIN) 5-325  MG tablet  Every 8 hours PRN     09/28/19 2058              This chart was dictated using voice recognition software/Dragon. Despite best efforts to proofread, errors can occur which can change the meaning. Any change was purely unintentional.    Laban Emperor, PA-C 09/28/19 2247    Blake Divine, MD 09/29/19 [phone]

## 2019-09-28 NOTE — ED Notes (Signed)
This RN reviewed discharge instructions, follow-up care, prescriptions, cryotherapy, and sling use with patient. Patient verbalized understanding of all reviewed information.  Patient stable, with no distress noted at this time.

## 2019-09-28 NOTE — ED Notes (Signed)
Pt going to the restroom

## 2020-04-14 ENCOUNTER — Other Ambulatory Visit
Admission: RE | Admit: 2020-04-14 | Discharge: 2020-04-14 | Disposition: A | Discharge status: Home / Self Care | Payer: Medicare Other | Source: Ambulatory Visit | Attending: Internal Medicine | Admitting: Internal Medicine

## 2020-04-14 ENCOUNTER — Other Ambulatory Visit: Payer: Self-pay

## 2020-04-14 DIAGNOSIS — Z01812 Encounter for preprocedural laboratory examination: Secondary | ICD-10-CM | POA: Diagnosis present

## 2020-04-14 DIAGNOSIS — Z20822 Contact with and (suspected) exposure to covid-19: Secondary | ICD-10-CM | POA: Diagnosis not present

## 2020-04-15 ENCOUNTER — Encounter: Payer: Self-pay | Admitting: Internal Medicine

## 2020-04-15 LAB — SARS CORONAVIRUS 2 (TAT 6-24 HRS): SARS Coronavirus 2: NEGATIVE

## 2020-04-16 ENCOUNTER — Encounter: Payer: Self-pay | Admitting: Internal Medicine

## 2020-04-16 ENCOUNTER — Encounter
Admission: RE | Disposition: A | Discharge status: Home / Self Care | Payer: Self-pay | Source: Home / Self Care | Attending: Internal Medicine

## 2020-04-16 ENCOUNTER — Ambulatory Visit: Payer: Medicare Other | Admitting: Anesthesiology

## 2020-04-16 ENCOUNTER — Other Ambulatory Visit: Payer: Self-pay

## 2020-04-16 ENCOUNTER — Ambulatory Visit
Admission: RE | Admit: 2020-04-16 | Discharge: 2020-04-16 | Disposition: A | Discharge status: Home / Self Care | Payer: Medicare Other | Attending: Internal Medicine | Admitting: Internal Medicine

## 2020-04-16 DIAGNOSIS — I2510 Atherosclerotic heart disease of native coronary artery without angina pectoris: Secondary | ICD-10-CM | POA: Diagnosis not present

## 2020-04-16 DIAGNOSIS — I10 Essential (primary) hypertension: Secondary | ICD-10-CM | POA: Insufficient documentation

## 2020-04-16 DIAGNOSIS — K640 First degree hemorrhoids: Secondary | ICD-10-CM | POA: Insufficient documentation

## 2020-04-16 DIAGNOSIS — I252 Old myocardial infarction: Secondary | ICD-10-CM | POA: Diagnosis not present

## 2020-04-16 DIAGNOSIS — K5730 Diverticulosis of large intestine without perforation or abscess without bleeding: Secondary | ICD-10-CM | POA: Diagnosis not present

## 2020-04-16 DIAGNOSIS — Z7902 Long term (current) use of antithrombotics/antiplatelets: Secondary | ICD-10-CM | POA: Diagnosis not present

## 2020-04-16 DIAGNOSIS — D123 Benign neoplasm of transverse colon: Secondary | ICD-10-CM | POA: Insufficient documentation

## 2020-04-16 DIAGNOSIS — Z8546 Personal history of malignant neoplasm of prostate: Secondary | ICD-10-CM | POA: Diagnosis not present

## 2020-04-16 DIAGNOSIS — Z79899 Other long term (current) drug therapy: Secondary | ICD-10-CM | POA: Diagnosis not present

## 2020-04-16 DIAGNOSIS — E785 Hyperlipidemia, unspecified: Secondary | ICD-10-CM | POA: Diagnosis not present

## 2020-04-16 DIAGNOSIS — Z1211 Encounter for screening for malignant neoplasm of colon: Secondary | ICD-10-CM | POA: Diagnosis not present

## 2020-04-16 DIAGNOSIS — Z7982 Long term (current) use of aspirin: Secondary | ICD-10-CM | POA: Insufficient documentation

## 2020-04-16 DIAGNOSIS — Z86010 Personal history of colonic polyps: Secondary | ICD-10-CM | POA: Insufficient documentation

## 2020-04-16 HISTORY — PX: COLONOSCOPY WITH PROPOFOL: SHX5780

## 2020-04-16 SURGERY — COLONOSCOPY WITH PROPOFOL
Anesthesia: General

## 2020-04-16 MED ORDER — SODIUM CHLORIDE 0.9 % IV SOLN
INTRAVENOUS | Status: DC
  Administered 2020-04-16: 13:00:00 1000 mL via INTRAVENOUS

## 2020-04-16 MED ORDER — PROPOFOL 500 MG/50ML IV EMUL
INTRAVENOUS | Status: DC | PRN
  Administered 2020-04-16: 125 ug/kg/min via INTRAVENOUS

## 2020-04-16 MED ORDER — PROPOFOL 500 MG/50ML IV EMUL
INTRAVENOUS | Status: AC
  Filled 2020-04-16: qty 50

## 2020-04-16 MED ORDER — PROPOFOL 10 MG/ML IV BOLUS
INTRAVENOUS | Status: DC | PRN
  Administered 2020-04-16: 80 mg via INTRAVENOUS

## 2020-04-16 NOTE — H&P (Signed)
  Outpatient short stay form Pre-procedure 04/16/2020 12:38 PM Billy Walker K. Alice Reichert, M.D.  Primary Physician: Juluis Pitch, M.D.  Reason for visit:  Personal history of adenomatous colon polyp (2016)  History of present illness:                            Patient presents for colonoscopy for a personal hx of colon polyps. The patient denies abdominal pain, abnormal weight loss or rectal bleeding.  Patient has hx of NSTEMI in 2018 and usually takes Plavix daily but has been holding the medication for the past 4-5 days.    No current facility-administered medications for this encounter.  Medications Prior to Admission  Medication Sig Dispense Refill Last Dose  . aspirin EC 81 MG tablet Take 81 mg by mouth daily.   Past Week at Unknown time  . atorvastatin (LIPITOR) 40 MG tablet Take 1 tablet (40 mg total) by mouth daily at 6 PM. 30 tablet 0 04/15/2020 at Unknown time  . clopidogrel (PLAVIX) 75 MG tablet Take 75 mg by mouth at bedtime.   Past Week at Unknown time  . enalapril (VASOTEC) 5 MG tablet Take 1 tablet (5 mg total) by mouth daily. 30 tablet 0 04/16/2020 at 0600am  . metoprolol tartrate (LOPRESSOR) 25 MG tablet Take 1 tablet (25 mg total) by mouth 2 (two) times daily. 60 tablet 0 04/16/2020 at 0600amtime  . nitroGLYCERIN (NITROSTAT) 0.4 MG SL tablet Place 1 tablet (0.4 mg total) under the tongue every 5 (five) minutes as needed for chest pain. 30 tablet 0      No Known Allergies   Past Medical History:  Diagnosis Date  . ED (erectile dysfunction)   . History of kidney stones   . Hyperlipemia   . Hypertension   . Prostate cancer ([place])   . Renal insufficiency   . Tubular adenoma     Review of systems:  Otherwise negative.    Physical Exam  Gen: Alert, oriented. Appears stated age.  HEENT: [place]/AT. PERRLA. Lungs: CTA, no wheezes. CV: RR nl S1, S2. Abd: soft, benign, no masses. BS+ Ext: No edema. Pulses 2+    Planned procedures: Proceed with colonoscopy. The patient  understands the nature of the planned procedure, indications, risks, alternatives and potential complications including but not limited to bleeding, infection, perforation, damage to internal organs and possible oversedation/side effects from anesthesia. The patient agrees and gives consent to proceed.  Please refer to procedure notes for findings, recommendations and patient disposition/instructions.     [name] K. Alice Reichert, M.D. Gastroenterology 04/16/2020  12:38 PM

## 2020-04-16 NOTE — Anesthesia Postprocedure Evaluation (Signed)
Anesthesia Post Note  Patient: Billy Walker  Procedure(s) Performed: COLONOSCOPY WITH PROPOFOL (N/A )  Patient location during evaluation: Endoscopy Anesthesia Type: General Level of consciousness: awake and alert Pain management: pain level controlled Vital Signs Assessment: post-procedure vital signs reviewed and stable Respiratory status: spontaneous breathing, nonlabored ventilation, respiratory function stable and patient connected to nasal cannula oxygen Cardiovascular status: blood pressure returned to baseline and stable Postop Assessment: no apparent nausea or vomiting Anesthetic complications: no   No complications documented.   Last Vitals:  Vitals:   04/16/20 1430 04/16/20 1440  BP: 93/65 120/74  Pulse:    Resp:    Temp:    SpO2:      Last Pain:  Vitals:   04/16/20 1440  TempSrc:   PainSc: 0-No pain                 Arita Miss

## 2020-04-16 NOTE — Transfer of Care (Signed)
Immediate Anesthesia Transfer of Care Note  Patient: Billy Walker  Procedure(s) Performed: COLONOSCOPY WITH PROPOFOL (N/A )  Patient Location: PACU and Endoscopy Unit  Anesthesia Type:General  Level of Consciousness: drowsy  Airway & Oxygen Therapy: Patient Spontanous Breathing  Post-op Assessment: Report given to RN and Post -op Vital signs reviewed and stable  Post vital signs: Reviewed and stable  Last Vitals:  Vitals Value Taken Time  BP 99/64 04/16/20 1423  Temp    Pulse 69 04/16/20 1425  Resp 16 04/16/20 1425  SpO2 95 % 04/16/20 1425  Vitals shown include unvalidated device data.  Last Pain:  Vitals:   04/16/20 1420  TempSrc: Temporal  PainSc: Asleep      Patients Stated Pain Goal: 0 (60/47/99 8721)  Complications: No complications documented.

## 2020-04-16 NOTE — Op Note (Signed)
[facility]astroenterology Patient Name: Billy Walker Procedure Date: 04/16/2020 1:52 PM MRN: [id number] Account #: [id number] Date of Birth: 1946/02/03 Admit Type: Outpatient Age: 74 Room: [facility] ENDO ROOM 3 Gender: Male Note Status: Finalized Procedure:             Colonoscopy Indications:           High risk colon cancer surveillance: Personal history                         of non-advanced adenoma Providers:             Benay Pike. Alice Reichert MD, MD Referring MD:          Youlanda Roys. Lovie Macadamia, MD (Referring MD) Medicines:             Propofol per Anesthesia Complications:         No immediate complications. Procedure:             Pre-Anesthesia Assessment:                        - The risks and benefits of the procedure and the                         sedation options and risks were discussed with the                         patient. All questions were answered and informed                         consent was obtained.                        - Patient identification and proposed procedure were                         verified prior to the procedure by the nurse. The                         procedure was verified in the procedure room.                        - ASA Grade Assessment: III - A patient with severe                         systemic disease.                        - After reviewing the risks and benefits, the patient                         was deemed in satisfactory condition to undergo the                         procedure.                        After obtaining informed consent, the colonoscope was                         passed under direct vision. Throughout the  procedure,                         the patient's blood pressure, pulse, and oxygen                         saturations were monitored continuously. The                         Colonoscope was introduced through the anus and                         advanced to the the cecum, identified by  appendiceal                         orifice and ileocecal valve. The colonoscopy was                         performed without difficulty. The patient tolerated                         the procedure well. The quality of the bowel                         preparation was good. The ileocecal valve, appendiceal                         orifice, and rectum were photographed. Findings:      The perianal and digital rectal examinations were normal. Pertinent       negatives include normal sphincter tone and no palpable rectal lesions.      Non-bleeding internal hemorrhoids were found during retroflexion. The       hemorrhoids were Grade I (internal hemorrhoids that do not prolapse).      A few small-mouthed diverticula were found in the sigmoid colon.      Two sessile polyps were found in the transverse colon and hepatic       flexure. The polyps were 3 to 4 mm in size. These polyps were removed       with a jumbo cold forceps. Resection and retrieval were complete.      The exam was otherwise without abnormality. Impression:            - Non-bleeding internal hemorrhoids.                        - Diverticulosis in the sigmoid colon.                        - Two 3 to 4 mm polyps in the transverse colon and at                         the hepatic flexure, removed with a jumbo cold                         forceps. Resected and retrieved.                        - The examination was otherwise normal. Recommendation:        - Patient has a contact number  available for                         emergencies. The signs and symptoms of potential                         delayed complications were discussed with the patient.                         Return to normal activities tomorrow. Written                         discharge instructions were provided to the patient.                        - Resume previous diet.                        - Continue present medications.                        - If polyps are  benign or adenomatous without                         dysplasia, I will advise NO further colonoscopy due to                         advanced age and/or severe comorbidity.                        - Return to GI office PRN.                        - The findings and recommendations were discussed with                         the patient.                        - Resume Plavix (clopidogrel) at prior dose today.                         Refer to managing physician for further adjustment of                         therapy. Procedure Code(s):     --- Professional ---                        [phone], Colonoscopy, flexible; with biopsy, single or                         multiple Diagnosis Code(s):     --- Professional ---                        K57.30, Diverticulosis of large intestine without                         perforation or abscess without bleeding  K63.5, Polyp of colon                        K64.0, First degree hemorrhoids                        Z86.010, Personal history of colonic polyps CPT copyright 2019 [employer]. All rights reserved. The codes documented in this report are preliminary and upon coder review may  be revised to meet current compliance requirements. Efrain Sella MD, MD 04/16/2020 2:23:40 PM This report has been signed electronically. Number of Addenda: 0 Note Initiated On: 04/16/2020 1:52 PM Scope Withdrawal Time: 0 hours 8 minutes 24 seconds  Total Procedure Duration: 0 hours 15 minutes 9 seconds  Estimated Blood Loss:  Estimated blood loss: none.      [facility]

## 2020-04-16 NOTE — Interval H&P Note (Signed)
History and Physical Interval Note:  04/16/2020 12:39 PM  Billy Walker  has presented today for surgery, with the diagnosis of prs hx colon polyps.  The various methods of treatment have been discussed with the patient and family. After consideration of risks, benefits and other options for treatment, the patient has consented to  Procedure(s): COLONOSCOPY WITH PROPOFOL (N/A) as a surgical intervention.  The patient's history has been reviewed, patient examined, no change in status, stable for surgery.  I have reviewed the patient's chart and labs.  Questions were answered to the patient's satisfaction.     [place], [place]

## 2020-04-16 NOTE — Anesthesia Preprocedure Evaluation (Addendum)
Anesthesia Evaluation  Patient identified by MRN, date of birth, ID band Patient awake    Reviewed: Allergy & Precautions, NPO status , Patient's Chart, lab work & pertinent test results  History of Anesthesia Complications Negative for: history of anesthetic complications  Airway Mallampati: II  TM Distance: >3 FB Neck ROM: Full    Dental no notable dental hx. (+) Teeth Intact   Pulmonary neg pulmonary ROS, neg sleep apnea, neg COPD, Patient abstained from smoking.Not current smoker,    Pulmonary exam normal breath sounds clear to auscultation       Cardiovascular Exercise Tolerance: Good METShypertension, + CAD, + Past MI and + Cardiac Stents  (-) dysrhythmias  Rhythm:Regular Rate:Normal - Systolic murmurs TTE 2683: INTERPRETATION  MILD LV SYSTOLIC DYSFUNCTION (See above)  WITH MILD LVH  NORMAL RIGHT VENTRICULAR SYSTOLIC FUNCTION  MILD VALVULAR REGURGITATION (See above)  NO VALVULAR STENOSIS  MILD MR, TR  EF 45%     Neuro/Psych negative neurological ROS  negative psych ROS   GI/Hepatic neg GERD  ,(+)     (-) substance abuse  ,   Endo/Other  neg diabetes  Renal/GU Renal InsufficiencyRenal disease     Musculoskeletal   Abdominal   Peds  Hematology   Anesthesia Other Findings Past Medical History: No date: ED (erectile dysfunction) No date: History of kidney stones No date: Hyperlipemia No date: Hypertension No date: Prostate cancer (HCC) No date: Renal insufficiency No date: Tubular adenoma  Reproductive/Obstetrics                            Anesthesia Physical Anesthesia Plan  ASA: III  Anesthesia Plan: General   Post-op Pain Management:    Induction: Intravenous  PONV Risk Score and Plan: 2 and Ondansetron, Propofol infusion and TIVA  Airway Management Planned: Nasal Cannula  Additional Equipment: None  Intra-op Plan:   Post-operative Plan:   Informed  Consent: I have reviewed the patients History and Physical, chart, labs and discussed the procedure including the risks, benefits and alternatives for the proposed anesthesia with the patient or authorized representative who has indicated his/her understanding and acceptance.     Dental advisory given  Plan Discussed with: CRNA and Surgeon  Anesthesia Plan Comments: (Discussed risks of anesthesia with patient, including possibility of difficulty with spontaneous ventilation under anesthesia necessitating airway intervention, PONV, and rare risks such as cardiac or respiratory or neurological events. Patient understands.)        Anesthesia Quick Evaluation

## 2020-04-17 ENCOUNTER — Encounter: Payer: Self-pay | Admitting: Internal Medicine

## 2020-04-18 LAB — SURGICAL PATHOLOGY

## 2021-09-09 IMAGING — CT CT SHOULDER*L* W/O CM
1 series · 12 of 14 positions shown, 15 images · non-contrast
Comparison: None.

CLINICAL DATA: Tripped, arm injury and pain

EXAM:
CT OF THE UPPER LEFT EXTREMITY WITHOUT CONTRAST
TECHNIQUE: Multidetector CT imaging of the upper left extremity was performed
according to the standard protocol.

[Series 7: ax st · axial · 0.53mm/px · z∈[-493,-348]mm · 12 of 95 slices shown, 15 images]
[im 8/95  soft-tissue]
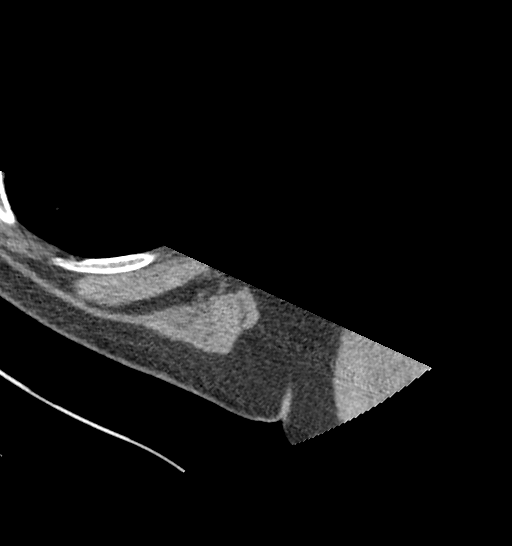
[im 8/95  bone]
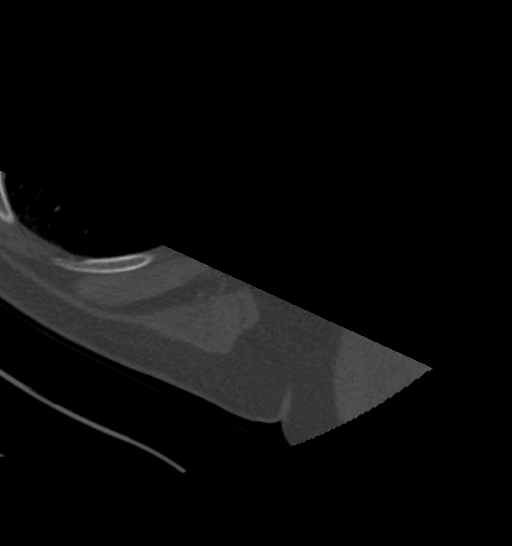
[im 15/95  bone]
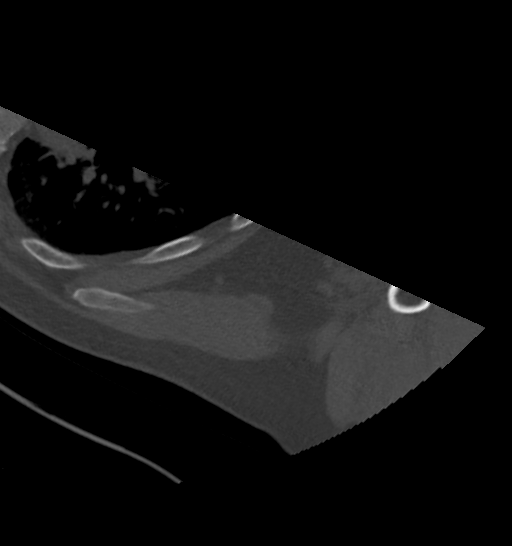
[im 22/95  bone]
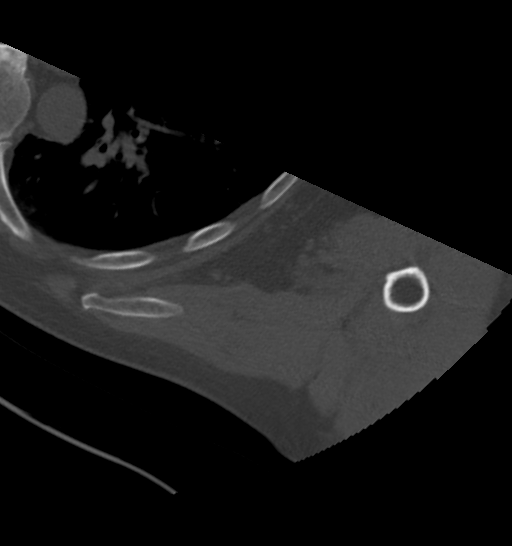
[im 29/95  bone]
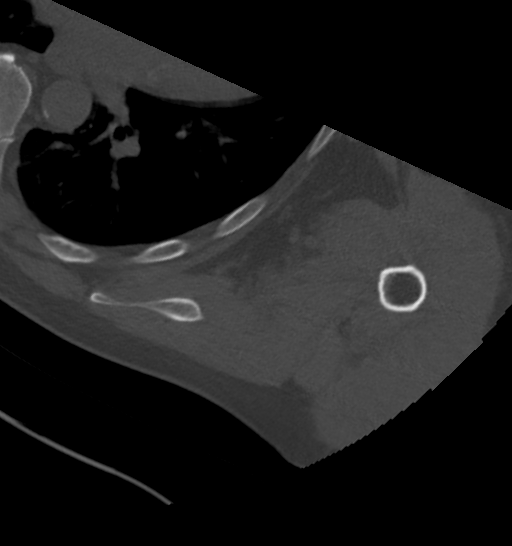
[im 37/95  soft-tissue]
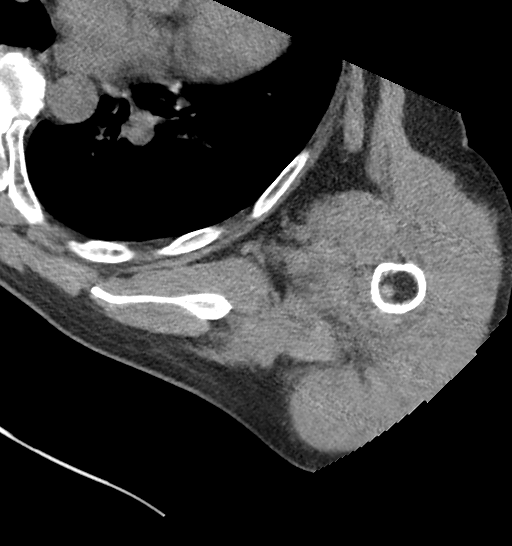
[im 37/95  bone]
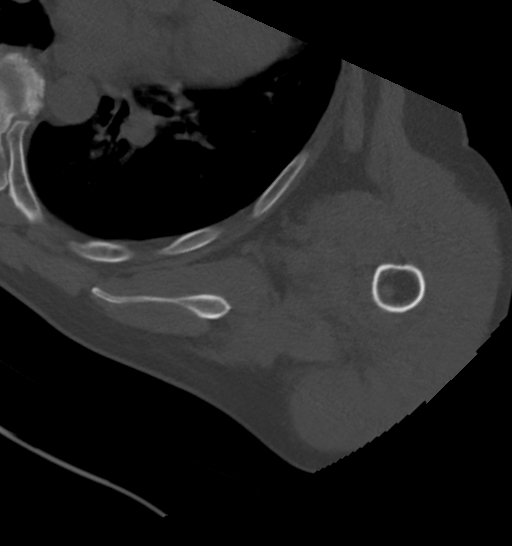
[im 44/95  bone]
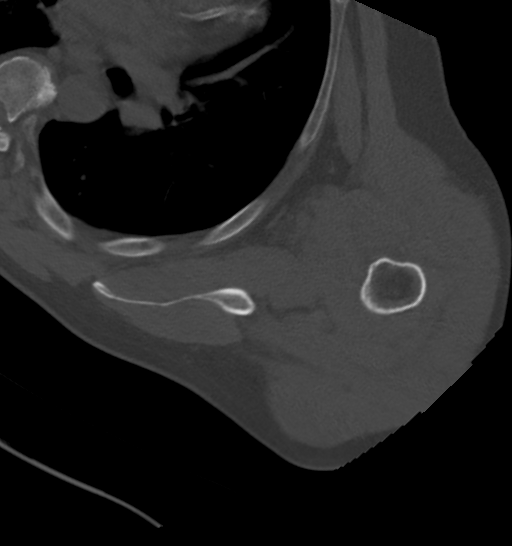
[im 51/95  bone]
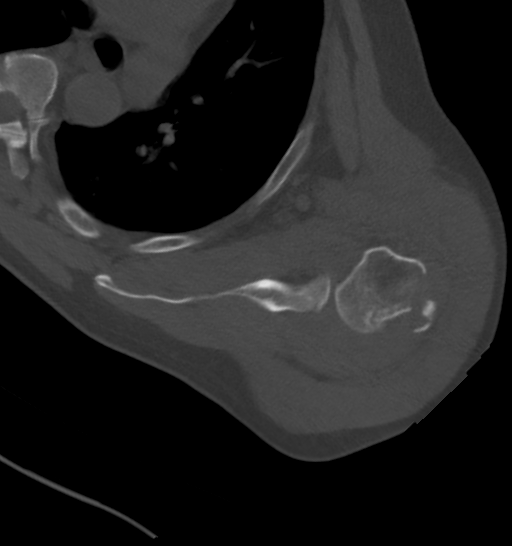
[im 58/95  bone]
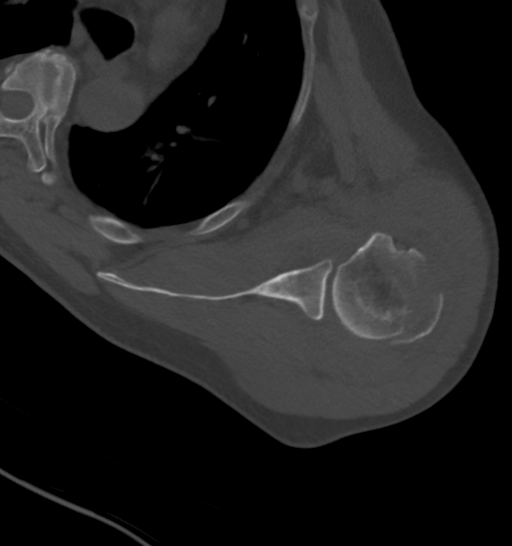
[im 66/95  soft-tissue]
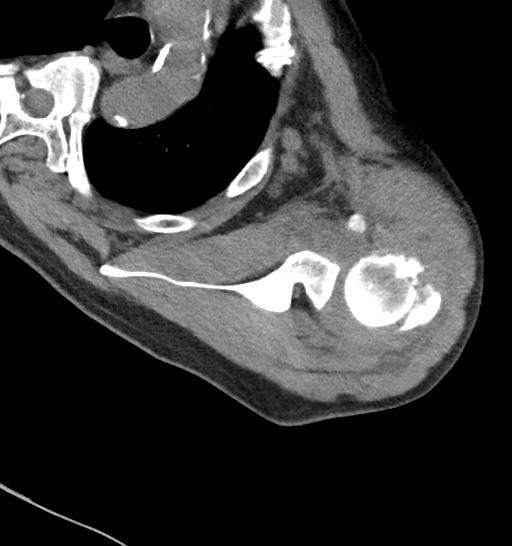
[im 66/95  bone]
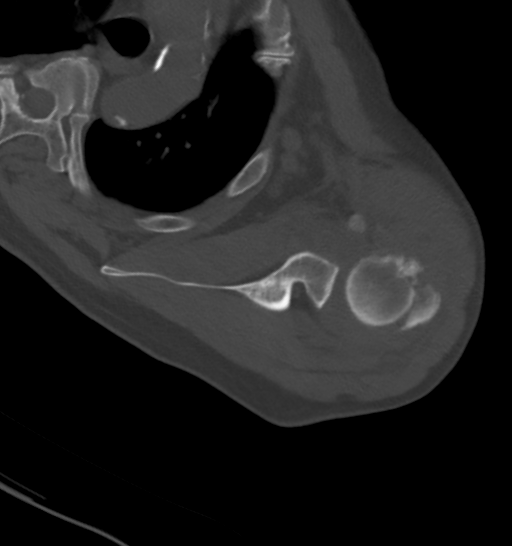
[im 73/95  bone]
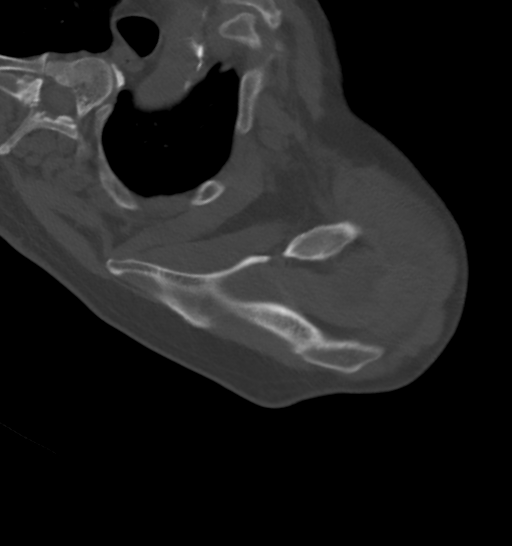
[im 80/95  bone]
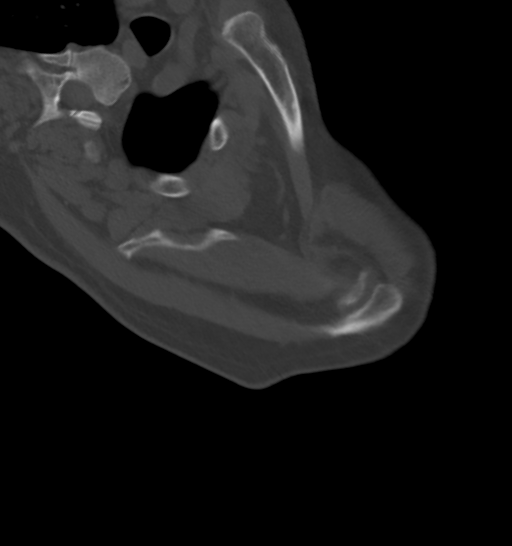
[im 87/95  bone]
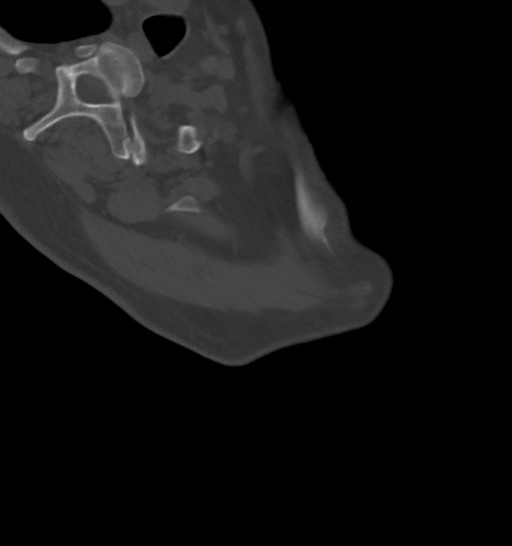

[12 of 14 positions shown; findings below may reference images not displayed]

FINDINGS: Bones/Joint/Cartilage

There is comminuted mildly displaced fracture involving the surgical
neck and greater tuberosity of the humerus. The humeral head still
articulates with the glenoid. The humeral head appears to be
slightly posteriorly rotated on the glenoid surface however. No
other fracture is seen. A small glenohumeral joint effusion is seen.

Ligaments

Suboptimally assessed by CT.

Muscles and Tendons

The muscles surrounding the shoulder are normal in appearance
without focal atrophy or tear. There is however mild edema seen
within the deltoid musculature. The rotator cuff tend to [place] appear
to be grossly intact.

Soft tissues

Mild soft tissue swelling seen around the anterolateral aspect of
the shoulder. Scattered aortic atherosclerosis is noted. Coronary
artery calcifications are seen.
IMPRESSION: Comminuted mildly displaced fracture involving the humerus surgical
neck and greater tuberosity.
